# Patient Record
Sex: Female | Born: 2000 | Race: Black or African American | Hispanic: No | Marital: Single | Smoking: Never smoker
Health system: Southern US, Community
[De-identification: ages and names within clinical notes are randomized; demographics above are authoritative.]

## PROBLEM LIST (undated history)

## (undated) ENCOUNTER — Ambulatory Visit (HOSPITAL_COMMUNITY): Admission: EM | Disposition: A | Discharge status: Home / Self Care | Payer: Medicaid Other

## (undated) DIAGNOSIS — Z8619 Personal history of other infectious and parasitic diseases: Secondary | ICD-10-CM

## (undated) DIAGNOSIS — R011 Cardiac murmur, unspecified: Secondary | ICD-10-CM

## (undated) HISTORY — PX: NO PAST SURGERIES: SHX2092

## (undated) HISTORY — DX: Cardiac murmur, unspecified: R01.1

## (undated) HISTORY — DX: Personal history of other infectious and parasitic diseases: Z86.19

---
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
## 2020-01-16 ENCOUNTER — Other Ambulatory Visit: Payer: Self-pay

## 2020-01-16 ENCOUNTER — Other Ambulatory Visit (HOSPITAL_COMMUNITY)
Admission: RE | Admit: 2020-01-16 | Discharge: 2020-01-16 | Disposition: A | Discharge status: Home / Self Care | Payer: Medicaid Other | Source: Ambulatory Visit | Attending: Medical | Admitting: Medical

## 2020-01-16 ENCOUNTER — Ambulatory Visit (INDEPENDENT_AMBULATORY_CARE_PROVIDER_SITE_OTHER): Payer: Medicaid Other | Admitting: Medical

## 2020-01-16 ENCOUNTER — Other Ambulatory Visit: Payer: Medicaid Other

## 2020-01-16 ENCOUNTER — Encounter: Payer: Self-pay | Admitting: Medical

## 2020-01-16 VITALS — BP 121/75 | HR 100 | Wt 146.0 lb

## 2020-01-16 DIAGNOSIS — O98812 Other maternal infectious and parasitic diseases complicating pregnancy, second trimester: Secondary | ICD-10-CM | POA: Diagnosis present

## 2020-01-16 DIAGNOSIS — A749 Chlamydial infection, unspecified: Secondary | ICD-10-CM

## 2020-01-16 DIAGNOSIS — Z3A26 26 weeks gestation of pregnancy: Secondary | ICD-10-CM

## 2020-01-16 DIAGNOSIS — Z3400 Encounter for supervision of normal first pregnancy, unspecified trimester: Secondary | ICD-10-CM

## 2020-01-16 MED ORDER — BLOOD PRESSURE KIT DEVI
1.0000 | 0 refills | Status: AC
Start: 2020-01-16 — End: ?

## 2020-01-16 NOTE — Progress Notes (Signed)
ROB she unable to get GTT done because she drank Cranberry Juice this AM.  Declined FLU and TDAP Vaccines. C/o sides hurting at night 5/10 making it hard to sleep.

## 2020-01-16 NOTE — Progress Notes (Signed)
   PRENATAL VISIT NOTE  Subjective:  Ashley Graham is a 19 y.o. G1P0 at [redacted]w[redacted]d being seen today for ongoing prenatal care.  She is currently monitored for the following issues for this low-risk pregnancy and has Supervision of normal first teen pregnancy on their problem list.  Patient reports no complaints.  Contractions: Not present. Vag. Bleeding: None.  Movement: Present. Denies leaking of fluid.   The following portions of the patient's history were reviewed and updated as appropriate: allergies, current medications, past family history, past medical history, past social history, past surgical history and problem list.   Objective:   Vitals:   01/16/20 0926  BP: 121/75  Pulse: 100  Weight: 146 lb (66.2 kg)    Fetal Status: Fetal Heart Rate (bpm): 136 Fundal Height: 26 cm Movement: Present     General:  Alert, oriented and cooperative. Patient is in no acute distress.  Skin: Skin is warm and dry. No rash noted.   Cardiovascular: Normal heart rate noted  Respiratory: Normal respiratory effort, no problems with respiration noted  Abdomen: Soft, gravid, appropriate for gestational age.  Pain/Pressure: Absent     Pelvic: Cervical exam deferred        Extremities: Normal range of motion.  Edema: None  Mental Status: Normal mood and affect. Normal behavior. Normal judgment and thought content.   Assessment and Plan:  Pregnancy: G1P0 at [redacted]w[redacted]d 1. Encounter for supervision of normal pregnancy in teen primigravida, antepartum - Patient not NPO, will defer GTT until next visit  - Enroll Patient in Babyscripts - Babyscripts Schedule Optimization  2. [redacted] weeks gestation of pregnancy  3. Chlamydia infection affecting pregnancy in second trimester - Treated early September, no intercourse since then - TOC today  - Cervicovaginal ancillary only( [place])  Preterm labor symptoms and general obstetric precautions including but not limited to vaginal bleeding, contractions, leaking of  fluid and fetal movement were reviewed in detail with the patient. Please refer to After Visit Summary for other counseling recommendations.   Return in about 2 weeks (around 01/30/2020) for LOB, In-Person, 28 week labs (fasting).  Future Appointments  Date Time Provider Department Center  01/30/2020  9:00 AM [facility] None  01/30/2020 10:55 AM Rasch, Harolyn Rutherford, NP [facility] None    Vonzella Nipple, PA-C

## 2020-01-16 NOTE — Patient Instructions (Addendum)
Second Trimester of Pregnancy  The second trimester is from week 14 through week 27 (month 4 through 6). This is often the time in pregnancy that you feel your best. Often times, morning sickness has lessened or quit. You may have more energy, and you may get hungry more often. Your unborn baby is growing rapidly. At the end of the sixth month, he or she is about 9 inches long and weighs about 1 pounds. You will likely feel the baby move between 18 and 20 weeks of pregnancy. Follow these instructions at home: Medicines  Take over-the-counter and prescription medicines only as told by your doctor. Some medicines are safe and some medicines are not safe during pregnancy.  Take a prenatal vitamin that contains at least 600 micrograms (mcg) of folic acid.  If you have trouble pooping (constipation), take medicine that will make your stool soft (stool softener) if your doctor approves. Eating and drinking   Eat regular, healthy meals.  Avoid raw meat and uncooked cheese.  If you get low calcium from the food you eat, talk to your doctor about taking a daily calcium supplement.  Avoid foods that are high in fat and sugars, such as fried and sweet foods.  If you feel sick to your stomach (nauseous) or throw up (vomit): ? Eat 4 or 5 small meals a day instead of 3 large meals. ? Try eating a few soda crackers. ? Drink liquids between meals instead of during meals.  To prevent constipation: ? Eat foods that are high in fiber, like fresh fruits and vegetables, whole grains, and beans. ? Drink enough fluids to keep your pee (urine) clear or pale yellow. Activity  Exercise only as told by your doctor. Stop exercising if you start to have cramps.  Do not exercise if it is too hot, too humid, or if you are in a place of great height (high altitude).  Avoid heavy lifting.  Wear low-heeled shoes. Sit and stand up straight.  You can continue to have sex unless your doctor tells you not  to. Relieving pain and discomfort  Wear a good support bra if your breasts are tender.  Take warm water baths (sitz baths) to soothe pain or discomfort caused by hemorrhoids. Use hemorrhoid cream if your doctor approves.  Rest with your legs raised if you have leg cramps or low back pain.  If you develop puffy, bulging veins (varicose veins) in your legs: ? Wear support hose or compression stockings as told by your doctor. ? Raise (elevate) your feet for 15 minutes, 3-4 times a day. ? Limit salt in your food. Prenatal care  Write down your questions. Take them to your prenatal visits.  Keep all your prenatal visits as told by your doctor. This is important. Safety  Wear your seat belt when driving.  Make a list of emergency phone numbers, including numbers for family, friends, the hospital, and police and fire departments. General instructions  Ask your doctor about the right foods to eat or for help finding a counselor, if you need these services.  Ask your doctor about local prenatal classes. Begin classes before month 6 of your pregnancy.  Do not use hot tubs, steam rooms, or saunas.  Do not douche or use tampons or scented sanitary pads.  Do not cross your legs for long periods of time.  Visit your dentist if you have not done so. Use a soft toothbrush to brush your teeth. Floss gently.  Avoid all smoking, herbs,   and alcohol. Avoid drugs that are not approved by your doctor.  Do not use any products that contain nicotine or tobacco, such as cigarettes and e-cigarettes. If you need help quitting, ask your doctor.  Avoid cat litter boxes and soil used by cats. These carry germs that can cause birth defects in the baby and can cause a loss of your baby (miscarriage) or stillbirth. Contact a doctor if:  You have mild cramps or pressure in your lower belly.  You have pain when you pee (urinate).  You have bad smelling fluid coming from your vagina.  You continue to  feel sick to your stomach (nauseous), throw up (vomit), or have watery poop (diarrhea).  You have a nagging pain in your belly area.  You feel dizzy. Get help right away if:  You have a fever.  You are leaking fluid from your vagina.  You have spotting or bleeding from your vagina.  You have severe belly cramping or pain.  You lose or gain weight rapidly.  You have trouble catching your breath and have chest pain.  You notice sudden or extreme puffiness (swelling) of your face, hands, ankles, feet, or legs.  You have not felt the baby move in over an hour.  You have severe headaches that do not go away when you take medicine.  You have trouble seeing. Summary  The second trimester is from week 14 through week 27 (months 4 through 6). This is often the time in pregnancy that you feel your best.  To take care of yourself and your unborn baby, you will need to eat healthy meals, take medicines only if your doctor tells you to do so, and do activities that are safe for you and your baby.  Call your doctor if you get sick or if you notice anything unusual about your pregnancy. Also, call your doctor if you need help with the right food to eat, or if you want to know what activities are safe for you. This information is not intended to replace advice given to you by your health care provider. Make sure you discuss any questions you have with your health care provider. Document Revised: 06/29/2018 Document Reviewed: 04/12/2016 Elsevier Patient Education  2020 Elsevier Inc.  AREA PEDIATRIC/FAMILY PRACTICE PHYSICIANS  [place] ([address]) . [facility] o Chambliss, MD; Eniola, MD; Hale, MD; Hensel, MD; McDiarmid, MD; McIntyer, MD; Neal, MD; Walden, MD o [address] [place], [place] [address] o [phone] o Mon-Fri 8:30-12:30, 1:30-5:00 o Providers come to see babies at [facility] o Accepting Medicaid . [facility] o Limited providers who accept newborns: Koirala, MD; Morrow, MD; Wolters, MD o [address], [place], [place] [address] o [phone] o Mon-Fri 8:00-5:30 o Babies seen by providers at [facility] o Does NOT accept Medicaid o Please call early in hospitalization for appointment (limited availability)  . [facility] o Mulberry, MD o [address] [place], [place] [address] o [phone] o Mon, Tue, Thur, Fri 8:30-5:00, Wed 10:00-7:00 (closed 1-2pm) o Babies seen by [facility] providers o Accepting Medicaid . [facility] o Rubin, MD o [address], , [place] [address] o [phone] o Mon-Fri 8:30-5:00, Sat 8:30-12:00 o Provider comes to see babies at [facility] o Accepting Medicaid o Must have been referred from current patients or contacted office prior to delivery . [facility] (  [facility]) o Brown, MD; Chandler, MD; Ettefagh, MD; Grant, MD; Lester, MD; McCormick, MD; McQueen, MD; Prose, MD; Simha, MD; Stanley, MD; Stryffeler, NP; Tebben, NP o [address], [place], [place] [address] o [phone] o Mon, Tue, Thur, Fri 8:30-5:30, Wed 9:30-5:30, Sat 8:30-12:30 o Babies seen by [facility] providers o Accepting Medicaid o Only accepting infants of first-time parents or siblings of current patients o Hospital discharge coordinator will make follow-up appointment . Jack Amos o [address], [place], [place]  [address] o [phone]   Fax - [phone] . [facility] o [address], [place], [place]  [address] o Phone - [phone]   Fax - [phone] . Shilpa Gosrani o [address], [place], [place]  [address] o [phone]  [place] ([address]) . [facility] o Bates, MD; [place], MD; Cooper, Cox, MD; MD; Davis, MD; Dovico, MD;  Ettefaugh, MD; Little, MD; Lowe, MD; Keiffer, MD; Melvin, MD; Sumner, MD; Williams, MD o [address], [place], [place] [address] o [phone] o Mon-Fri 8:30-5:00 (extended evenings Mon-Thur as needed), Sat-Sun 10:00-1:00 o Providers come to see babies at [facility] o Accepting Medicaid for families of first-time babies and families with all children in the household age 3 and under. Must register with office prior to making appointment (M-F only). . [facility] o Henson, NP; Knapp, MD; Lalonde, MD; Tysinger, PA o [address] [place], [place] [address] o [phone] o Mon-Fri 8:00-5:00 o Babies seen by providers at [facility] o Does NOT accept Medicaid/Commercial Insurance Only . [facility] ([facility])  o Artis, MD; Barnes, MD; Bratton, MD; Coccaro, MD; Lockett Gardner, MD; Kramer, MD; Marshall, MD; Netherton, MD; Poleto, MD; Skinner, MD o [address] [place], [place] [address] o [phone] o Mon-Fri 8:30-5:30, Sat (Oct.-Mar.) 9:00-1:00 o Babies seen by providers at [facility] o Accepting Medicaid  [place] ([address]) . [facility] o Reid, MD; Warner, MD o [address], [place], [place] [address] o [phone] o Mon-Fri 8:30-5:00, Sat 8:30-12:00 o Providers come to see babies at [facility] o Does NOT accept Medicaid . [facility] o Becker, PA; Hagler, MD; Scifres, PA; Sun, MD; Swayne, MD o [address], [place], [place] [address] o [phone] o Mon-Fri 8:00-5:00 o Babies seen by providers at [facility] o Does NOT accept Medicaid o Only accepting babies of parents who are patients o Please call early in hospitalization for appointment (limited availability) . [facility] o Clark, MD; Frye, MD; Kelleher, MD; Mack, NP; Miller, MD; O'Keller, MD; Patterson, NP; Pudlo, MD; Puzio, MD; Thomas, MD;  Tucker, MD; Twiselton, MD o [address], [place], [place] [address] o [phone] o Mon-Fri 8:00-5:00, Sat 9:00-12:00 o Providers come to see babies at [facility] o Does NOT accept Medicaid  [place] ([address]) . [facility] o Limited providers accepting new patients: Brake, NP; Wharton, PA o [address], [place], [place] [address] o [phone] o Mon-Fri 8:00-5:00 o Babies seen by providers at [facility] o Does NOT accept Medicaid o Only accepting babies of parents who are patients o Please call early in hospitalization for appointment (limited availability) . [facility] o Gay, MD; Quinlan, MD o [address] , [place] [address] o [phone] (press 1 to schedule appointment) o Mon-Fri 8:00-5:00 o Providers come to   see babies at [facility] o Does NOT accept Medicaid . [facility] o Mazer, MD o [address] [place], [place] [address] o [phone] o Mon-Fri 8:30-5:00 (lunch 12:30-1:00), extended hours by appointment only Wed 5:00-6:30 o Babies seen by [facility] providers o Accepting Medicaid . [facility] o Banks, MD; Jordan, MD; Koberlein, MD o [address], [place], [place] [address] o [phone] o Mon-Fri 8:00-5:00 o Babies seen by [facility] providers o Does NOT accept Medicaid . [facility] o Parker, MD; Hunter, MD; Wallace, DO o [address] [place], [place] [address] o [phone] o Mon-Fri 8:00-5:00 o Babies seen by [facility] providers o Does NOT accept Medicaid . [facility] o Brandon, PA; Brecken, PA; Christy, NP; Dees, MD; DeClaire, MD; DeWeese, MD; Hansen, NP; Mills, NP; Parrish, NP; Smoot, NP; Summer, MD; Vapne, MD o [address] [place], [place] [address] o [phone] o Mon-Fri 8:30-5:00, Sat 10:00-1:00 o Providers come to see babies at [facility] o Does NOT accept Medicaid o Free prenatal information session Tuesdays at 4:45pm . [facility] o Bouska, MD; Gordon, PA; Jeffery, PA; Weber, PA o [address] [place] [address] o [phone] o Mon-Fri 7:30-5:30 o Babies seen by [facility] providers . [facility] o [address], [place], [place]  [address] o [phone]   Fax - [phone]  [place] ([address]) . [facility] o Reese, MD o [address] [place], [place] [address] o [phone] o Mon-Thur 8:00-6:00 o Providers come to see babies at [facility] o Accepting Medicaid . [facility] o Anderson, NP; Badger, MD; Beal, PA; Spencer, PA o [address] [place], [place] [address] o [phone] o Mon-Thur 7:30-7:30, Fri 7:30-4:30 o Babies seen by [facility] providers o Accepting Medicaid . [facility] o Agbuya, MD; Klett, NP; Romgoolam, MD o [address], [place], [place] [address] o [phone] o Mon-Fri 8:30-5:00, Sat 8:30-12:00 o Providers come to see babies at [facility] o Accepting Medicaid o Must have "Meet & Greet" appointment at office prior to delivery . [facility] ([facility] o McCord, MD; Wallace, MD; Wood, MD o [address], [place], [place] [address] o [phone] o Mon-Wed 8:00-6:00, Thur-Fri 8:00-5:00, Sat 9:00-12:00 o Providers come to see babies at [facility] o Does NOT accept Medicaid o Only accepting siblings of current patients . [facility]  o [address], [place], [place]  [address] o [phone]   Fax - [phone] . [facility] o [address], [place], [place]  [address] o [phone]   Fax - [phone]  [place] ([address] & [address]) . [facility] o Cirigliano, DO; Matthews, DO o [address] [place], [place] [address] o [phone] o Mon-Fri 7:00-5:00 o Babies seen by [facility] providers o Does NOT accept Medicaid . [facility] o Briscoe, MD; Howley, PA; Moreira, PA o [address], [place], [place] [address] o [phone] o Mon-Fri 8:00-5:00 o Babies seen by [facility] providers o Accepting Medicaid . [facility] o Boyd, MD; Church, PA; Jones, NP; Osborn, PA o [address], [place],  [address] o [phone] o Mon-Fri 8:00-5:00 o Babies seen by providers at [facility] o   Accepting Medicaid  [place] ([address]) . [facility] [place] o Wendling, DO o [address] [place], [place] [address] o [phone] o Mon-Fri 8:00-5:00 o Babies seen by [facility] providers o Does NOT accept Medicaid o Limited availability, please call early in hospitalization to schedule follow-up . [facility] o Calderon, PA; Cummings, MD; Dillard, MD; Martin, PA; Olson, MD; VanDeven, PA o [address], [place], [place] [address] o [phone] o Mon-Fri 8:30-5:00, Sat 9:00-12:00 o Babies seen by providers at [facility] o Accepting Medicaid o Please register online then schedule online or call office o [email or web address] . [facility] ([facility] o Hunter, NP; Kumar, MD; Martin Rogers, PA o [address], [place], [place] [address] o [phone] o Mon-Fri 8:00-5:00 o Babies seen by providers at [facility] o Accepting Medicaid . [facility] ([facility] o Honaker, MD; Kristi Fleenor, NP; West, MD o [address], [place], [place] [address] o [phone] o Mon-Fri 8:00-5:30, Sat&Sun by  appointment (phones open at 8:30) o Babies seen by [facility] providers o Accepting Medicaid o Must be a first-time baby or sibling of current patient . [facility] [place]  o [address], [place], [place]  [address] o [phone]   Fax - [phone]  [place] ([address]) . [facility] o Brown, PA; Cowen, PA; Rice, MD; Helton, PA; Spry, MD o [address] [place], [place] [address] o [phone] o Mon-Thur 8:00-7:00, Fri 8:00-5:00, Sat 8:00-12:00, Sun 9:00-12:00 o Babies seen by [facility] providers o Accepting Medicaid . [facility] [place] o Coe-Goins, MD; Marshall, MD; Pierre-Louis, MD o [address] [place] [address], [place], [place] [address] o [phone] o Mon-Thur 8:00-5:00 o Babies seen by providers at [facility] o Accepting Medicaid . [facility] o Bratton, MD; Coe-Goins, MD; Hayes, MD; Lewis, MD; List, MD; Crittendon, MD; Marshall, MD; Moran, MD; O'Neal, MD; Pierre-Louis, MD; Pitonzo, MD; Scholer, MD; Spangle, MD o [address] [place], [place] [address] o [phone] o Mon-Fri 8:00-5:30, Sat (Oct.-Mar.) 9:00-1:00 o Babies seen by providers at [facility] o Accepting Medicaid o Must fill out new patient packet, available online at [email or web address] . [facility] ([facility] o Friddle, NP; Harris, NP; Kelly, NP; Logan, MD; Melvin, PA; Poth, MD; Ramadoss, MD; Stanton, NP o [address], [place], [place] [address] o [phone] o Mon-Thur 8:00-5:30, Fri 8:00-5:00 o Babies seen by providers at [facility] o Accepting Medicaid  [place] ([address]) . [facility] o Dixon, PA; Evanston, MD; Pickard, MD; Tapia, PA o [address], [place], [place] [address] o [phone] o Mon-Fri 8:00-5:00 o Babies seen  by providers at [facility] o Accepting Medicaid   [place] ([address]) . [facility] [place] o Masneri, DO; Meyers, MD; Nelson, PA o [address], [place], [place] [address] o [phone] o Mon-Fri 8:00-5:00 o Babies seen by providers at [facility] o Does NOT accept Medicaid o Limited appointment availability, please call early in hospitalization  . [facility] [place] o Kunedd, DO; McGowen, MD o [address], [place], [place] [address] o [phone] o   Mon-Fri 8:00-5:00 o Babies seen by [facility] providers o Does NOT accept Medicaid . [facility] [place] o Cameron, MD; MacDonald, MD; Michaels, PA; Nayak, MD o [address] [place] [address], [place], [place] [address] o [phone] o Mon-Fri 8:00-5:00 o After hours clinic ([address] [place], [place] [address]) [phone] Mon-Fri 5:00-8:00, Sat 12:00-6:00, Sun 10:00-4:00 o Babies seen by [facility] providers o Accepting Medicaid . [facility] [place] o [address], [place], [place]  [address] o [phone]   Fax - [phone]  [place] ([address]) . [facility] [place] o Andy, MD o [address], [place], [place] [address] o [phone] o Mon-Fri 8:00-5:00 o Babies seen by [facility] providers o Does NOT accept Medicaid . [facility] [place] ([facility] [place]) o Eksir, MD o [address], [place], [place] [address] o [phone] o Mon-Thur 8:00-7:00, Fri 8:00-5:00, Sat 8:00-12:00 o Babies seen by providers at [facility] o Accepting Medicaid - but does not have vaccinations in office (must be received elsewhere) o Limited availability, please call early in hospitalization  [place] ([address]) . [facility]  o Charlene Flemming, MD o [address], [place] [address] o [phone]  Fax [phone]    

## 2020-01-17 LAB — CBC
Hematocrit: 27.1 % — ABNORMAL LOW (ref 34.0–46.6)
Hemoglobin: 8.5 g/dL — ABNORMAL LOW (ref 11.1–15.9)
MCH: 27 pg (ref 26.6–33.0)
MCHC: 31.4 g/dL — ABNORMAL LOW (ref 31.5–35.7)
MCV: 86 fL (ref 79–97)
Platelets: 269 10*3/uL (ref 150–450)
RBC: 3.15 x10E6/uL — ABNORMAL LOW (ref 3.77–5.28)
RDW: 13.4 % (ref 11.7–15.4)
WBC: 9.2 10*3/uL (ref 3.4–10.8)

## 2020-01-17 LAB — CERVICOVAGINAL ANCILLARY ONLY
Chlamydia: NEGATIVE
Comment: NEGATIVE
Comment: NEGATIVE
Comment: NORMAL
Neisseria Gonorrhea: NEGATIVE
Trichomonas: NEGATIVE

## 2020-01-17 LAB — RPR: RPR Ser Ql: NONREACTIVE

## 2020-01-17 LAB — HIV ANTIBODY (ROUTINE TESTING W REFLEX): HIV Screen 4th Generation wRfx: NONREACTIVE

## 2020-01-17 NOTE — Addendum Note (Signed)
Addended by: Marny Lowenstein on: 01/17/2020 08:55 AM   Modules accepted: Orders, SmartSet

## 2020-01-19 ENCOUNTER — Other Ambulatory Visit: Payer: Self-pay

## 2020-01-19 ENCOUNTER — Encounter (HOSPITAL_COMMUNITY): Payer: Self-pay | Admitting: Obstetrics and Gynecology

## 2020-01-19 ENCOUNTER — Inpatient Hospital Stay (HOSPITAL_COMMUNITY)
Admission: AD | Admit: 2020-01-19 | Discharge: 2020-01-19 | Disposition: A | Discharge status: Home / Self Care | Payer: Medicaid Other | Attending: Obstetrics & Gynecology | Admitting: Obstetrics & Gynecology

## 2020-01-19 DIAGNOSIS — O99891 Other specified diseases and conditions complicating pregnancy: Secondary | ICD-10-CM | POA: Diagnosis not present

## 2020-01-19 DIAGNOSIS — Z20822 Contact with and (suspected) exposure to covid-19: Secondary | ICD-10-CM | POA: Diagnosis not present

## 2020-01-19 DIAGNOSIS — O99322 Drug use complicating pregnancy, second trimester: Secondary | ICD-10-CM | POA: Diagnosis not present

## 2020-01-19 DIAGNOSIS — R059 Cough, unspecified: Secondary | ICD-10-CM | POA: Diagnosis not present

## 2020-01-19 DIAGNOSIS — R0981 Nasal congestion: Secondary | ICD-10-CM | POA: Insufficient documentation

## 2020-01-19 DIAGNOSIS — O99513 Diseases of the respiratory system complicating pregnancy, third trimester: Secondary | ICD-10-CM | POA: Insufficient documentation

## 2020-01-19 DIAGNOSIS — R058 Other specified cough: Secondary | ICD-10-CM

## 2020-01-19 DIAGNOSIS — R6883 Chills (without fever): Secondary | ICD-10-CM | POA: Diagnosis not present

## 2020-01-19 DIAGNOSIS — Z3A27 27 weeks gestation of pregnancy: Secondary | ICD-10-CM | POA: Diagnosis not present

## 2020-01-19 LAB — RESPIRATORY PANEL BY RT PCR (FLU A&B, COVID)
Influenza A by PCR: NEGATIVE
Influenza B by PCR: NEGATIVE
SARS Coronavirus 2 by RT PCR: NEGATIVE

## 2020-01-19 NOTE — MAU Note (Addendum)
Pt reports that for the last two days she has been having chills, cold sweats, and congestion.  Pt denies taking any otc medication for the sx's.  +FM

## 2020-01-19 NOTE — [facility] Provider Note (Signed)
History     CSN: [id number]  Arrival date and time: 01/19/20 5885   First Provider Initiated Contact with Patient 01/19/20 [phone]      Chief Complaint  Patient presents with  . Cough  . Nasal Congestion  . Chills   HPI  Ashley Graham is a 19 y.o. G1P0 at [redacted]w[redacted]d who presents with cough, congestion, & chills. Symptoms started 2 days ago. Reports that firs the last 2 nights she was woken up with chills. Does not have a thermometer at home so has been unable to check her temperature. She reports chest & nasal congestion. Has had a non productive cough. She has not taken anything for her symptoms.\ Denies sore throat, ear pain, headache, chest pain, SOB, n/v, abdominal pain. Her little brother is sick with similar symptoms. She has not had the COVID or flu vaccines. Denies any OB complaints.   OB History    Gravida  1   Para      Term      Preterm      AB      Living        SAB      TAB      Ectopic      Multiple      Live Births              Past Medical History:  Diagnosis Date  . Heart murmur   . History of PCR DNA positive for HSV1     Past Surgical History:  Procedure Laterality Date  . NO PAST SURGERIES      Family History  Problem Relation Age of Onset  . Healthy Mother   . Diabetes Maternal Uncle   . Diabetes Other   . Hypertension Other     Social History   Tobacco Use  . Smoking status: Never Smoker  . Smokeless tobacco: Never Used  Vaping Use  . Vaping Use: Never used  Substance Use Topics  . Alcohol use: Never  . Drug use: Yes    Types: Marijuana    Comment: last use 11/14/19    Allergies: No Known Allergies  Medications Prior to Admission  Medication Sig Dispense Refill Last Dose  . Prenatal Vit-Fe Phos-FA-Omega (VITAFOL GUMMIES) 3.33-0.333-34.8 MG CHEW Chew 3 tablets by mouth daily before breakfast. 90 tablet 11 01/19/2020 at Unknown time  . Blood Pressure Monitoring (BLOOD PRESSURE KIT) DEVI 1 kit by Does not apply route  once a week. Check Blood Pressure regularly and record readings into the [employer] App.  Large Cuff.  DX O90.0 1 each 0 Unknown at Unknown time  . cetirizine (ZYRTEC ALLERGY) 10 MG tablet Take 1 tablet (10 mg total) by mouth daily for 14 days. 14 tablet 0   . fluticasone (FLONASE) 50 MCG/ACT nasal spray Place 1 spray into both nostrils daily. (Patient not taking: Reported on 11/06/2019) 11.1 mL 0 Unknown at Unknown time    Review of Systems  Constitutional: Positive for chills. Negative for fever.  HENT: Positive for congestion. Negative for ear pain, sinus pain, sneezing and sore throat.   Respiratory: Positive for cough. Negative for shortness of breath and wheezing.   Cardiovascular: Negative.   Gastrointestinal: Negative.   Genitourinary: Negative.   Musculoskeletal: Negative for myalgias.  Neurological: Negative for headaches.   Physical Exam   Blood pressure 124/82, pulse 98, temperature 98.8 F (37.1 C), temperature source Oral, resp. rate 16, weight 66.4 kg, last menstrual period 07/14/2019, SpO2 99 %.  Physical  Exam Vitals and nursing note reviewed.  Constitutional:      General: She is not in acute distress.    Appearance: Normal appearance. She is not ill-appearing, toxic-appearing or diaphoretic.  HENT:     Head: Normocephalic and atraumatic.  Cardiovascular:     Rate and Rhythm: Normal rate and regular rhythm.     Heart sounds: Normal heart sounds.  Pulmonary:     Effort: Pulmonary effort is normal. No respiratory distress.     Breath sounds: Normal breath sounds. No wheezing.  Skin:    General: Skin is warm and dry.  Neurological:     Mental Status: She is alert.  Psychiatric:        Mood and Affect: Mood normal.        Behavior: Behavior normal.    Fetal Tracing:  Baseline: 130 Variability: min to mod Accelerations: 10x10 Decelerations: none  Toco: x1   MAU Course  Procedures No results found for this or any previous visit (from the past 24  hour(s)).  MDM Fetal tracing appropriate for gestational age.  No OB complaints.   Vital signs WNL. She is afebrile. Denies chest pain or SOB. Lung sounds are clear throughout.  Will test for flu & COVID.  Discussed symptomatic treatment regardless of results. Reviewed that if COVID is positive we would recommend outpatient MAB, if flu positive would prescribe tamiflu. She is agreeable with this plan.   Assessment and Plan   1. Nasal congestion   2. Non-productive cough   3. Chills (without fever)   4. [redacted] weeks gestation of pregnancy    -COVID & flu tests pending -Discussed symptomatic treatment & given list of OTC Medications safe to take at home. Reviewed quarantining at home until results come back later today.   Jorje Guild 01/19/2020, 6:16 AM

## 2020-01-19 NOTE — Discharge Instructions (Signed)

## 2020-01-28 ENCOUNTER — Inpatient Hospital Stay (HOSPITAL_COMMUNITY): Admission: RE | Admit: 2020-01-28 | Payer: Medicaid Other | Source: Ambulatory Visit

## 2020-01-30 ENCOUNTER — Other Ambulatory Visit: Payer: Medicaid Other

## 2020-01-30 ENCOUNTER — Other Ambulatory Visit: Payer: Self-pay

## 2020-01-30 ENCOUNTER — Ambulatory Visit (INDEPENDENT_AMBULATORY_CARE_PROVIDER_SITE_OTHER): Payer: Medicaid Other | Admitting: Obstetrics and Gynecology

## 2020-01-30 VITALS — BP 137/67 | HR 86 | Wt 147.4 lb

## 2020-01-30 DIAGNOSIS — O99013 Anemia complicating pregnancy, third trimester: Secondary | ICD-10-CM

## 2020-01-30 DIAGNOSIS — Z23 Encounter for immunization: Secondary | ICD-10-CM

## 2020-01-30 DIAGNOSIS — Z3400 Encounter for supervision of normal first pregnancy, unspecified trimester: Secondary | ICD-10-CM

## 2020-01-30 NOTE — Progress Notes (Signed)
   PRENATAL VISIT NOTE  Subjective:  Ashley Graham is a 19 y.o. G1P0 at [redacted]w[redacted]d being seen today for ongoing prenatal care.  She is currently monitored for the following issues for this low-risk pregnancy and has Supervision of normal first teen pregnancy on their problem list.  Patient reports no complaints.  Contractions: Not present. Vag. Bleeding: None.  Movement: Present. Denies leaking of fluid.   The following portions of the patient's history were reviewed and updated as appropriate: allergies, current medications, past family history, past medical history, past social history, past surgical history and problem list.   Objective:   Vitals:   01/30/20 1017  BP: 137/67  Pulse: 86  Weight: 147 lb 6.4 oz (66.9 kg)    Fetal Status:     Movement: Present     General:  Alert, oriented and cooperative. Patient is in no acute distress.  Skin: Skin is warm and dry. No rash noted.   Cardiovascular: Normal heart rate noted  Respiratory: Normal respiratory effort, no problems with respiration noted  Abdomen: Soft, gravid, appropriate for gestational age.  Pain/Pressure: Absent     Pelvic: Cervical exam deferred        Extremities: Normal range of motion.  Edema: None  Mental Status: Normal mood and affect. Normal behavior. Normal judgment and thought content.   Assessment and Plan:  Pregnancy: G1P0 at [redacted]w[redacted]d 1. Encounter for supervision of normal pregnancy in teen primigravida, antepartum  - Will get TDAP next visit.  - Glucose Tolerance, 2 Hours w/1 Hour - CBC - HIV Antibody (routine testing w rflx) - RPR  Preterm labor symptoms and general obstetric precautions including but not limited to vaginal bleeding, contractions, leaking of fluid and fetal movement were reviewed in detail with the patient. Please refer to After Visit Summary for other counseling recommendations.   No follow-ups on file.  Future Appointments  Date Time Provider Department Center  02/12/2020  1:40 PM  Burleson, Brand Males, NP [facility] None    Venia Carbon, NP

## 2020-01-30 NOTE — Progress Notes (Signed)
ROB  CC asking if there is something she can use to position herself for a better night sleep. Not use to sleeping side or back.  Unsure of T-dap wants to discuss.   T-Dap given

## 2020-01-31 LAB — GLUCOSE TOLERANCE, 2 HOURS W/ 1HR
Glucose, 1 hour: 124 mg/dL (ref 65–179)
Glucose, 2 hour: 93 mg/dL (ref 65–152)
Glucose, Fasting: 70 mg/dL (ref 65–91)

## 2020-01-31 LAB — CBC
Hematocrit: 26.5 % — ABNORMAL LOW (ref 34.0–46.6)
Hemoglobin: 8.6 g/dL — ABNORMAL LOW (ref 11.1–15.9)
MCH: 27 pg (ref 26.6–33.0)
MCHC: 32.5 g/dL (ref 31.5–35.7)
MCV: 83 fL (ref 79–97)
Platelets: 254 10*3/uL (ref 150–450)
RBC: 3.19 x10E6/uL — ABNORMAL LOW (ref 3.77–5.28)
RDW: 13.4 % (ref 11.7–15.4)
WBC: 7.2 10*3/uL (ref 3.4–10.8)

## 2020-01-31 LAB — HIV ANTIBODY (ROUTINE TESTING W REFLEX): HIV Screen 4th Generation wRfx: NONREACTIVE

## 2020-01-31 LAB — RPR: RPR Ser Ql: NONREACTIVE

## 2020-02-02 ENCOUNTER — Other Ambulatory Visit: Payer: Self-pay | Admitting: Obstetrics and Gynecology

## 2020-02-02 DIAGNOSIS — O99019 Anemia complicating pregnancy, unspecified trimester: Secondary | ICD-10-CM

## 2020-02-02 HISTORY — DX: Anemia complicating pregnancy, unspecified trimester: O99.019

## 2020-02-02 MED ORDER — IRON 325 (65 FE) MG PO TABS: 1.0000 | ORAL_TABLET | Freq: Two times a day (BID) | ORAL | 1 refills | Status: DC

## 2020-02-02 NOTE — Progress Notes (Signed)
an

## 2020-02-12 ENCOUNTER — Ambulatory Visit (INDEPENDENT_AMBULATORY_CARE_PROVIDER_SITE_OTHER): Payer: Medicaid Other | Admitting: Nurse Practitioner

## 2020-02-12 ENCOUNTER — Other Ambulatory Visit: Payer: Self-pay

## 2020-02-12 VITALS — BP 131/75 | HR 94 | Wt 150.2 lb

## 2020-02-12 DIAGNOSIS — O99013 Anemia complicating pregnancy, third trimester: Secondary | ICD-10-CM

## 2020-02-12 DIAGNOSIS — Z3400 Encounter for supervision of normal first pregnancy, unspecified trimester: Secondary | ICD-10-CM

## 2020-02-12 DIAGNOSIS — Z3A30 30 weeks gestation of pregnancy: Secondary | ICD-10-CM

## 2020-02-12 MED ORDER — DOCUSATE CALCIUM 240 MG PO CAPS: 240.0000 mg | ORAL_CAPSULE | Freq: Every day | ORAL | 2 refills | Status: DC

## 2020-02-12 NOTE — Patient Instructions (Signed)
Schedule childbirth and breastfeeding classes at [email or web address]

## 2020-02-12 NOTE — Progress Notes (Signed)
    Subjective:  Ashley Graham is a 19 y.o. G1P0 at [redacted]w[redacted]d being seen today for ongoing prenatal care.  She is currently monitored for the following issues for this low-risk pregnancy and has Supervision of normal first teen pregnancy and Anemia in pregnancy on their problem list.  Patient reports no complaints.  Contractions: Not present. Vag. Bleeding: None.  Movement: Present. Denies leaking of fluid.   The following portions of the patient's history were reviewed and updated as appropriate: allergies, current medications, past family history, past medical history, past social history, past surgical history and problem list. Problem list updated.  Objective:   Vitals:   02/12/20 1319  BP: 131/75  Pulse: 94  Weight: 150 lb 3.2 oz (68.1 kg)    Fetal Status: Fetal Heart Rate (bpm): 138 Fundal Height: 30 cm Movement: Present     General:  Alert, oriented and cooperative. Patient is in no acute distress.  Skin: Skin is warm and dry. No rash noted.   Cardiovascular: Normal heart rate noted  Respiratory: Normal respiratory effort, no problems with respiration noted  Abdomen: Soft, gravid, appropriate for gestational age. Pain/Pressure: Absent     Pelvic:  Cervical exam deferred        Extremities: Normal range of motion.  Edema: None  Mental Status: Normal mood and affect. Normal behavior. Normal judgment and thought content.   Urinalysis:      Assessment and Plan:  Pregnancy: G1P0 at [redacted]w[redacted]d  1. Encounter for supervision of normal pregnancy in teen primigravida, antepartum Advised to sign up for breastfeeding and childbirth classes - website given in AVS Does not plan to get Covid vaccine Had a cold recently and went to [facility] - now is better without lingering symptoms  2. Anemia during pregnancy in third trimester Planning to start iron supplement today. Was not aware fereheme had been ordered - in check out note to schedule infusion  - Anemia panel   Preterm labor symptoms and  general obstetric precautions including but not limited to vaginal bleeding, contractions, leaking of fluid and fetal movement were reviewed in detail with the patient. Please refer to After Visit Summary for other counseling recommendations.  Return in about 2 weeks (around 02/26/2020) for in person ROB.  Nolene Bernheim, RN, MSN, NP-BC Nurse Practitioner, [facility] for [employer], [facility], [facility] 02/12/2020 2:26 PM

## 2020-02-12 NOTE — Progress Notes (Signed)
Pt reports fetal movement, denies pain.  

## 2020-02-15 LAB — ANEMIA PANEL
Ferritin: 7 ng/mL — ABNORMAL LOW (ref 15–77)
Hematocrit: 27.2 % — ABNORMAL LOW (ref 34.0–46.6)
Iron Saturation: 50 % (ref 15–55)
Iron: 271 ug/dL (ref 27–159)
Retic Ct Pct: 2.6 % (ref 0.6–2.6)
Total Iron Binding Capacity: 537 ug/dL — ABNORMAL HIGH (ref 250–450)
UIBC: 266 ug/dL (ref 131–425)
Vitamin B-12: 426 pg/mL (ref 232–1245)

## 2020-02-26 ENCOUNTER — Encounter: Payer: Self-pay | Admitting: Obstetrics

## 2020-02-26 ENCOUNTER — Other Ambulatory Visit: Payer: Self-pay

## 2020-02-26 ENCOUNTER — Ambulatory Visit (INDEPENDENT_AMBULATORY_CARE_PROVIDER_SITE_OTHER): Payer: Medicaid Other | Admitting: Obstetrics

## 2020-02-26 VITALS — BP 129/78 | HR 94 | Wt 158.2 lb

## 2020-02-26 DIAGNOSIS — Z3400 Encounter for supervision of normal first pregnancy, unspecified trimester: Secondary | ICD-10-CM

## 2020-02-26 DIAGNOSIS — O99013 Anemia complicating pregnancy, third trimester: Secondary | ICD-10-CM

## 2020-02-26 NOTE — Progress Notes (Signed)
Subjective:  Ashley Graham is a 19 y.o. G1P0 at [redacted]w[redacted]d being seen today for ongoing prenatal care.  She is currently monitored for the following issues for this low-risk pregnancy and has Supervision of normal first teen pregnancy and Anemia in pregnancy on their problem list.  Patient reports that she thinks that she may have had some leaking of fluid last week, but no leaking since then.  Contractions: Not present. Vag. Bleeding: None.  Movement: Present. Denies leaking of fluid.   The following portions of the patient's history were reviewed and updated as appropriate: allergies, current medications, past family history, past medical history, past social history, past surgical history and problem list. Problem list updated.  Objective:   Vitals:   02/26/20 1334  BP: 129/78  Pulse: 94  Weight: 158 lb 3.2 oz (71.8 kg)    Fetal Status:     Movement: Present     General:  Alert, oriented and cooperative. Patient is in no acute distress.  Skin: Skin is warm and dry. No rash noted.   Cardiovascular: Normal heart rate noted  Respiratory: Normal respiratory effort, no problems with respiration noted  Abdomen: Soft, gravid, appropriate for gestational age. Pain/Pressure: Absent     Pelvic:  Cervical exam performed      SSE: no pooling.  Nitrazine negative.  Cvx closed.  Extremities: Normal range of motion.  Edema: None  Mental Status: Normal mood and affect. Normal behavior. Normal judgment and thought content.   Urinalysis:      Assessment and Plan:  Pregnancy: G1P0 at [redacted]w[redacted]d  1. Encounter for supervision of normal pregnancy in teen primigravida, antepartum  2. Anemia during pregnancy in third trimester - FERAHEME infusion ordered  Preterm labor symptoms and general obstetric precautions including but not limited to vaginal bleeding, contractions, leaking of fluid and fetal movement were reviewed in detail with the patient. Please refer to After Visit Summary for other counseling  recommendations.   Return in about 2 weeks (around 03/11/2020) for ROB.   Brock Bad, MD  02/26/20

## 2020-02-26 NOTE — Progress Notes (Signed)
Pt reports fetal movement, denies pain. Pt states that last week she was leaking clear water like fluid, wants to check to make sure it is not amniotic fluid.

## 2020-03-04 NOTE — Discharge Instructions (Signed)

## 2020-03-05 ENCOUNTER — Encounter (HOSPITAL_COMMUNITY)
Admission: RE | Admit: 2020-03-05 | Discharge: 2020-03-05 | Disposition: A | Discharge status: Home / Self Care | Payer: Medicaid Other | Source: Ambulatory Visit | Attending: Obstetrics and Gynecology | Admitting: Obstetrics and Gynecology

## 2020-03-05 ENCOUNTER — Other Ambulatory Visit: Payer: Self-pay

## 2020-03-05 DIAGNOSIS — O99013 Anemia complicating pregnancy, third trimester: Secondary | ICD-10-CM | POA: Diagnosis present

## 2020-03-05 DIAGNOSIS — Z3A00 Weeks of gestation of pregnancy not specified: Secondary | ICD-10-CM | POA: Diagnosis not present

## 2020-03-05 DIAGNOSIS — D649 Anemia, unspecified: Secondary | ICD-10-CM | POA: Diagnosis not present

## 2020-03-05 MED ORDER — SODIUM CHLORIDE 0.9 % IV SOLN
510.0000 mg | INTRAVENOUS | Status: DC
  Administered 2020-03-05: 510 mg via INTRAVENOUS
  Filled 2020-03-05: qty 510

## 2020-03-11 ENCOUNTER — Ambulatory Visit (INDEPENDENT_AMBULATORY_CARE_PROVIDER_SITE_OTHER): Payer: Medicaid Other | Admitting: Women's Health

## 2020-03-11 ENCOUNTER — Other Ambulatory Visit: Payer: Self-pay

## 2020-03-11 VITALS — BP 127/81 | HR 109 | Wt 166.4 lb

## 2020-03-11 DIAGNOSIS — Z3403 Encounter for supervision of normal first pregnancy, third trimester: Secondary | ICD-10-CM

## 2020-03-11 DIAGNOSIS — O99013 Anemia complicating pregnancy, third trimester: Secondary | ICD-10-CM

## 2020-03-11 DIAGNOSIS — O26843 Uterine size-date discrepancy, third trimester: Secondary | ICD-10-CM

## 2020-03-11 NOTE — Patient Instructions (Signed)
Maternity Assessment Unit (MAU)  The Maternity Assessment Unit (MAU) is located at the [facility]. The address is: [address] [place], [place], [place] [address]. Please see map below for additional directions.    The Maternity Assessment Unit is designed to help you during your pregnancy, and for up to 6 weeks after delivery, with any pregnancy- or postpartum-related emergencies, if you think you are in labor, or if your water has broken. For example, if you experience nausea and vomiting, vaginal bleeding, severe abdominal or pelvic pain, elevated blood pressure or other problems related to your pregnancy or postpartum time, please come to the Maternity Assessment Unit for assistance.       AREA PEDIATRIC/FAMILY PRACTICE PHYSICIANS  [facility] [address] [place], [place] [address] Phone - [phone]   Fax - [place] [address] [place], [place]  [address] Phone - [phone]   Fax - [phone]  [place] [address] [place] [address] [place], [place]  [address] Phone - [phone]   Fax - [phone]  [facility] [address] [place], [place]  [address] Phone - [phone]   Fax - [phone]  [place] [address], [place], [place]  [address] Phone - [phone]   Fax - [place] [address] [place], [place]  [address] Phone - [phone]   Fax - [place] [facility] [place] [address], [place], [place]  [address] Phone - [phone]   Fax - [phone]  [place] [address] [place], [place], [place]  [address] Phone - [phone]   Fax - [place] [address] [place], [place]  [address] Phone - [phone]   Fax -  [phone] [facility] [place]. [address] [place], [place]  [address] Phone - [phone]   Fax - [phone]  [place] [address] [place], [place]  [address] Phone - [phone]   Fax - [phone]  [facility] [place], [place], [place]  [address] Phone - [phone]   Fax - [place] [address], [place], [place]  [address] Phone - [phone]   Fax - [phone]  [facility] [address], [place], [place]  [address] Phone - [place], [place]  [address] Phone - [phone]   Fax - [place] [address], [place], [place]  [address] Phone - [phone]   Fax - [phone]  [place] [address] [place], [place]  [address] Phone - [phone]   Fax - [place], [place]  [address] Phone - [phone]   Fax - [place], [place], [place]  [address] Phone - [phone]   Fax - [place] [address], [address] [place], [place]  [address] Phone - [phone]   Fax - [phone]  [place] RUBIN [address] [place], [place]  [address] Phone - [phone]   Fax - [place] [address] [place],   [address] Phone - [phone]  Fax - [phone]  [facility] [address] [place], [place]  [address] Phone - [phone]   Fax - [phone] Gerarda Fraction [phone] [address] [place], [place]  [address] Phone - [phone]   Fax - [phone]  [place] [address] [place], [place]  [address] Phone - [phone]   Fax - [phone]  [facility] [place] [address] [place], [place]  [address] Phone - [phone]   Fax - [phone]          Group B Streptococcus Test During Pregnancy Why am I having this test? Routine testing, also called screening, for group B streptococcus (GBS) is recommended for all pregnant women between the 36th and 37th week of pregnancy. GBS is a type of bacteria that can be passed from mother to baby during childbirth. Screening will help guide whether or not you will need treatment during labor and delivery to prevent complications such as:  An infection in your uterus during labor.  An infection in your uterus after delivery.  A serious infection in your baby after delivery, such as pneumonia, meningitis, or sepsis. GBS screening is not often done before 36 weeks of pregnancy unless you go into labor prematurely. What happens if I have group B streptococcus? If testing shows that you have GBS, your health care provider will recommend treatment with IV antibiotics during labor and delivery. This treatment significantly decreases the risk of complications for you and your baby. If you have a planned C-section and you have GBS, you may not need to be treated with antibiotics because GBS is usually passed to babies after labor starts and your water breaks. If you are in labor or your water breaks before your C-section, it is possible for GBS to get into your uterus and be passed to your baby, so you might need treatment. Is there a chance I may not need to be tested? You may not need to be tested for GBS if:  You have a urine test that shows GBS before 36 to 37 weeks.  You had a baby with GBS infection after a previous delivery. In these cases, you will automatically be treated for GBS during labor and delivery. What is being tested? This test is done to check if you have group B streptococcus in your vagina or rectum. What kind of sample is taken? To  collect samples for this test, your health care provider will swab your vagina and rectum with a cotton swab. The sample is then sent to the lab to see if GBS is present. What happens during the test?   You will remove your clothing from the waist down.  You will lie down on an exam table in the same position as you would for a pelvic exam.  Your health care provider will swab your vagina and rectum to collect samples for a culture test.  You will be able to go home after the test and do all your usual activities. How are the results reported? The test results are reported as positive or negative. What do the results mean?  A positive test means you are at risk for passing GBS to your baby during labor and delivery. Your health care provider will recommend that you are treated with an IV antibiotic during labor and delivery.  A negative test means you are at very low risk of passing GBS to your baby. There is still a low risk  of passing GBS to your baby because sometimes test results may report that you do not have a condition when you do (false-negative result) or there is a chance that you may become infected with GBS after the test is done. You most likely will not need to be treated with an antibiotic during labor and delivery. Talk with your health care provider about what your results mean. Questions to ask your health care provider Ask your health care provider, or the department that is doing the test:  When will my results be ready?  How will I get my results?  What are my treatment options? Summary  Routine testing (screening) for group B streptococcus (GBS) is recommended for all pregnant women between the 36th and 37th week of pregnancy.  GBS is a type of bacteria that can be passed from mother to baby during childbirth.  If testing shows that you have GBS, your health care provider will recommend that you are treated with IV antibiotics during labor and delivery. This  treatment almost always prevents infection in newborns. This information is not intended to replace advice given to you by your health care provider. Make sure you discuss any questions you have with your health care provider. Document Revised: 06/28/2018 Document Reviewed: 04/04/2018 Elsevier Patient Education  2020 [employer].        Third Trimester of Pregnancy The third trimester is from week 28 through week 40 (months 7 through 9). The third trimester is a time when the unborn baby (fetus) is growing rapidly. At the end of the ninth month, the fetus is about 20 inches in length and weighs 6-10 pounds. Body changes during your third trimester Your body will continue to go through many changes during pregnancy. The changes vary from woman to woman. During the third trimester:  Your weight will continue to increase. You can expect to gain 25-35 pounds (11-16 kg) by the end of the pregnancy.  You may begin to get stretch marks on your hips, abdomen, and breasts.  You may urinate more often because the fetus is moving lower into your pelvis and pressing on your bladder.  You may develop or continue to have heartburn. This is caused by increased hormones that slow down muscles in the digestive tract.  You may develop or continue to have constipation because increased hormones slow digestion and cause the muscles that push waste through your intestines to relax.  You may develop hemorrhoids. These are swollen veins (varicose veins) in the rectum that can itch or be painful.  You may develop swollen, bulging veins (varicose veins) in your legs.  You may have increased body aches in the pelvis, back, or thighs. This is due to weight gain and increased hormones that are relaxing your joints.  You may have changes in your hair. These can include thickening of your hair, rapid growth, and changes in texture. Some women also have hair loss during or after pregnancy, or hair that feels dry  or thin. Your hair will most likely return to normal after your baby is born.  Your breasts will continue to grow and they will continue to become tender. A yellow fluid (colostrum) may leak from your breasts. This is the first milk you are producing for your baby.  Your belly button may stick out.  You may notice more swelling in your hands, face, or ankles.  You may have increased tingling or numbness in your hands, arms, and legs. The skin on your belly may  also feel numb.  You may feel short of breath because of your expanding uterus.  You may have more problems sleeping. This can be caused by the size of your belly, increased need to urinate, and an increase in your body's metabolism.  You may notice the fetus "dropping," or moving lower in your abdomen (lightening).  You may have increased vaginal discharge.  You may notice your joints feel loose and you may have pain around your pelvic bone. What to expect at prenatal visits You will have prenatal exams every 2 weeks until week 36. Then you will have weekly prenatal exams. During a routine prenatal visit:  You will be weighed to make sure you and the baby are growing normally.  Your blood pressure will be taken.  Your abdomen will be measured to track your baby's growth.  The fetal heartbeat will be listened to.  Any test results from the previous visit will be discussed.  You may have a cervical check near your due date to see if your cervix has softened or thinned (effaced).  You will be tested for Group B streptococcus. This happens between 35 and 37 weeks. Your health care provider may ask you:  What your birth plan is.  How you are feeling.  If you are feeling the baby move.  If you have had any abnormal symptoms, such as leaking fluid, bleeding, severe headaches, or abdominal cramping.  If you are using any tobacco products, including cigarettes, chewing tobacco, and electronic cigarettes.  If you have any  questions. Other tests or screenings that may be performed during your third trimester include:  Blood tests that check for low iron levels (anemia).  Fetal testing to check the health, activity level, and growth of the fetus. Testing is done if you have certain medical conditions or if there are problems during the pregnancy.  Nonstress test (NST). This test checks the health of your baby to make sure there are no signs of problems, such as the baby not getting enough oxygen. During this test, a belt is placed around your belly. The baby is made to move, and its heart rate is monitored during movement. What is false labor? False labor is a condition in which you feel small, irregular tightenings of the muscles in the womb (contractions) that usually go away with rest, changing position, or drinking water. These are called Braxton Hicks contractions. Contractions may last for hours, days, or even weeks before true labor sets in. If contractions come at regular intervals, become more frequent, increase in intensity, or become painful, you should see your health care provider. What are the signs of labor?  Abdominal cramps.  Regular contractions that start at 10 minutes apart and become stronger and more frequent with time.  Contractions that start on the top of the uterus and spread down to the lower abdomen and back.  Increased pelvic pressure and dull back pain.  A watery or bloody mucus discharge that comes from the vagina.  Leaking of amniotic fluid. This is also known as your "water breaking." It could be a slow trickle or a gush. Let your health care provider know if it has a color or strange odor. If you have any of these signs, call your health care provider right away, even if it is before your due date. Follow these instructions at home: Medicines  Follow your health care provider's instructions regarding medicine use. Specific medicines may be either safe or unsafe to take during  pregnancy.  Take a prenatal vitamin that contains at least 600 micrograms (mcg) of folic acid.  If you develop constipation, try taking a stool softener if your health care provider approves. Eating and drinking   Eat a balanced diet that includes fresh fruits and vegetables, whole grains, good sources of protein such as meat, eggs, or tofu, and low-fat dairy. Your health care provider will help you determine the amount of weight gain that is right for you.  Avoid raw meat and uncooked cheese. These carry germs that can cause birth defects in the baby.  If you have low calcium intake from food, talk to your health care provider about whether you should take a daily calcium supplement.  Eat four or five small meals rather than three large meals a day.  Limit foods that are high in fat and processed sugars, such as fried and sweet foods.  To prevent constipation: ? Drink enough fluid to keep your urine clear or pale yellow. ? Eat foods that are high in fiber, such as fresh fruits and vegetables, whole grains, and beans. Activity  Exercise only as directed by your health care provider. Most women can continue their usual exercise routine during pregnancy. Try to exercise for 30 minutes at least 5 days a week. Stop exercising if you experience uterine contractions.  Avoid heavy lifting.  Do not exercise in extreme heat or humidity, or at high altitudes.  Wear low-heel, comfortable shoes.  Practice good posture.  You may continue to have sex unless your health care provider tells you otherwise. Relieving pain and discomfort  Take frequent breaks and rest with your legs elevated if you have leg cramps or low back pain.  Take warm sitz baths to soothe any pain or discomfort caused by hemorrhoids. Use hemorrhoid cream if your health care provider approves.  Wear a good support bra to prevent discomfort from breast tenderness.  If you develop varicose veins: ? Wear support pantyhose  or compression stockings as told by your healthcare provider. ? Elevate your feet for 15 minutes, 3-4 times a day. Prenatal care  Write down your questions. Take them to your prenatal visits.  Keep all your prenatal visits as told by your health care provider. This is important. Safety  Wear your seat belt at all times when driving.  Make a list of emergency phone numbers, including numbers for family, friends, the hospital, and police and fire departments. General instructions  Avoid cat litter boxes and soil used by cats. These carry germs that can cause birth defects in the baby. If you have a cat, ask someone to clean the litter box for you.  Do not travel far distances unless it is absolutely necessary and only with the approval of your health care provider.  Do not use hot tubs, steam rooms, or saunas.  Do not drink alcohol.  Do not use any products that contain nicotine or tobacco, such as cigarettes and e-cigarettes. If you need help quitting, ask your health care provider.  Do not use any medicinal herbs or unprescribed drugs. These chemicals affect the formation and growth of the baby.  Do not douche or use tampons or scented sanitary pads.  Do not cross your legs for long periods of time.  To prepare for the arrival of your baby: ? Take prenatal classes to understand, practice, and ask questions about labor and delivery. ? Make a trial run to the hospital. ? Visit the hospital and tour the maternity area. ? Arrange for maternity  or paternity leave through employers. ? Arrange for family and friends to take care of pets while you are in the hospital. ? Purchase a rear-facing car seat and make sure you know how to install it in your car. ? Pack your hospital bag. ? Prepare the baby's nursery. Make sure to remove all pillows and stuffed animals from the baby's crib to prevent suffocation.  Visit your dentist if you have not gone during your pregnancy. Use a soft  toothbrush to brush your teeth and be gentle when you floss. Contact a health care provider if:  You are unsure if you are in labor or if your water has broken.  You become dizzy.  You have mild pelvic cramps, pelvic pressure, or nagging pain in your abdominal area.  You have lower back pain.  You have persistent nausea, vomiting, or diarrhea.  You have an unusual or bad smelling vaginal discharge.  You have pain when you urinate. Get help right away if:  Your water breaks before 37 weeks.  You have regular contractions less than 5 minutes apart before 37 weeks.  You have a fever.  You are leaking fluid from your vagina.  You have spotting or bleeding from your vagina.  You have severe abdominal pain or cramping.  You have rapid weight loss or weight gain.  You have shortness of breath with chest pain.  You notice sudden or extreme swelling of your face, hands, ankles, feet, or legs.  Your baby makes fewer than 10 movements in 2 hours.  You have severe headaches that do not go away when you take medicine.  You have vision changes. Summary  The third trimester is from week 28 through week 40, months 7 through 9. The third trimester is a time when the unborn baby (fetus) is growing rapidly.  During the third trimester, your discomfort may increase as you and your baby continue to gain weight. You may have abdominal, leg, and back pain, sleeping problems, and an increased need to urinate.  During the third trimester your breasts will keep growing and they will continue to become tender. A yellow fluid (colostrum) may leak from your breasts. This is the first milk you are producing for your baby.  False labor is a condition in which you feel small, irregular tightenings of the muscles in the womb (contractions) that eventually go away. These are called Braxton Hicks contractions. Contractions may last for hours, days, or even weeks before true labor sets in.  Signs of  labor can include: abdominal cramps; regular contractions that start at 10 minutes apart and become stronger and more frequent with time; watery or bloody mucus discharge that comes from the vagina; increased pelvic pressure and dull back pain; and leaking of amniotic fluid. This information is not intended to replace advice given to you by your health care provider. Make sure you discuss any questions you have with your health care provider. Document Revised: 06/28/2018 Document Reviewed: 04/12/2016 Elsevier Patient Education  2020 Else[employer] Inc[employer]        Pregnancy and Anemia  Anemia is a condition in which the concentration of red blood cells, or hemoglobin, in the blood is below normal. Hemoglobin is a substance in red blood cells that carries oxygen to the tissues of the body. Anemia results when enough oxygen does not reach these tissues. Anemia is common during pregnancy because the woman's body needs more blood volume and blood cells to provide nutrition to the fetus. The fetus needs iron  and folic acid as it is developing. Your body may not produce enough red blood cells because of this. Also, during pregnancy, the liquid part of the blood (plasma) increases by about 30-50%, and the red blood cells increase by only 20%. This lowers the concentration of the red blood cells and creates a natural anemia-like situation. What are the causes? The most common cause of anemia during pregnancy is not having enough iron in the body to make red blood cells (iron deficiency anemia). Other causes may include:  Folic acid deficiency.  Vitamin B12 deficiency.  Certain prescription or over-the-counter medicines.  Certain medical conditions or infections that destroy red blood cells.  A low platelet count and bleeding caused by antibodies that go through the placenta to the fetus from the mother's blood. What are the signs or symptoms? Mild anemia may not be noticeable. If it becomes severe,  symptoms may include:  Feeling tired (fatigue).  Shortness of breath, especially during activity.  Weakness.  Fainting.  Pale looking skin.  Headaches.  A fast or irregular heartbeat (palpitations).  Dizziness. How is this diagnosed? This condition may be diagnosed based on:  Your medical history and a physical exam.  Blood tests. How is this treated? Treatment for anemia during pregnancy depends on the cause of the anemia. Treatment can include:  Dietary changes.  Supplements of iron, vitamin B12, or folic acid.  A blood transfusion. This may be needed if anemia is severe.  Hospitalization. This may be needed if there is a lot of blood loss or severe anemia. Follow these instructions at home:  Follow recommendations from your dietitian or health care provider about changing your diet.  Increase your vitamin C intake. This will help the stomach absorb more iron. Some foods that are high in vitamin C include: ? Oranges. ? Peppers. ? Tomatoes. ? Mangoes.  Eat a diet rich in iron. This would include foods such as: ? Liver. ? Beef. ? Eggs. ? Whole grains. ? Spinach. ? Dried fruit.  Take iron and vitamins as told by your health care provider.  Eat green leafy vegetables. These are a good source of folic acid.  Keep all follow-up visits as told by your health care provider. This is important. Contact a health care provider if:  You have frequent or lasting headaches.  You look pale.  You bruise easily. Get help right away if:  You have extreme weakness, shortness of breath, or chest pain.  You become dizzy or have trouble concentrating.  You have heavy vaginal bleeding.  You develop a rash.  You have bloody or black, tarry stools.  You faint.  You vomit up blood.  You vomit repeatedly.  You have abdominal pain.  You have a fever.  You are dehydrated. Summary  Anemia is a condition in which the concentration of red blood cells or  hemoglobin in the blood is below normal.  Anemia is common during pregnancy because the woman's body needs more blood volume and blood cells to provide nutrition to the fetus.  The most common cause of anemia during pregnancy is not having enough iron in the body to make red blood cells (iron deficiency anemia).  Mild anemia may not be noticeable. If it becomes severe, symptoms may include feeling tired and weak. This information is not intended to replace advice given to you by your health care provider. Make sure you discuss any questions you have with your health care provider. Document Revised: 08/21/2018 Document Reviewed: 04/12/2016 Elsevier  Patient Education  [employer].

## 2020-03-11 NOTE — Progress Notes (Signed)
Pt reports fetal movement, denies pain.  

## 2020-03-11 NOTE — Progress Notes (Signed)
Subjective:  Ashley Graham is a 19 y.o. G1P0 at [redacted]w[redacted]d being seen today for ongoing prenatal care.  She is currently monitored for the following issues for this low-risk pregnancy and has Supervision of normal first teen pregnancy and Anemia in pregnancy on their problem list.  Patient reports no complaints.  Contractions: Not present. Vag. Bleeding: None.  Movement: Present. Denies leaking of fluid.   The following portions of the patient's history were reviewed and updated as appropriate: allergies, current medications, past family history, past medical history, past social history, past surgical history and problem list. Problem list updated.  Objective:   Vitals:   03/11/20 1559  BP: 127/81  Pulse: (!) 109  Weight: 166 lb 6.4 oz (75.5 kg)    Fetal Status: Fetal Heart Rate (bpm): 146 Fundal Height: 39 cm Movement: Present     General:  Alert, oriented and cooperative. Patient is in no acute distress.  Skin: Skin is warm and dry. No rash noted.   Cardiovascular: Normal heart rate noted  Respiratory: Normal respiratory effort, no problems with respiration noted  Abdomen: Soft, gravid, appropriate for gestational age. Pain/Pressure: Absent     Pelvic: Vag. Bleeding: None     Cervical exam deferred        Extremities: Normal range of motion.  Edema: None  Mental Status: Normal mood and affect. Normal behavior. Normal judgment and thought content.   Urinalysis:      Assessment and Plan:  Pregnancy: G1P0 at [redacted]w[redacted]d  1. Supervision of normal first teen pregnancy in third trimester -peds list given -GBS next visit  2. Anemia during pregnancy in third trimester -fereheme 03/05/2020, 2nd appt 03/12/2020  3. Uterine size-date discrepancy in third trimester - [place] MFM OB FOLLOW UP; Future  Preterm labor symptoms and general obstetric precautions including but not limited to vaginal bleeding, contractions, leaking of fluid and fetal movement were reviewed in detail with the patient. I  discussed the assessment and treatment plan with the patient. The patient was provided an opportunity to ask questions and all were answered. The patient agreed with the plan and demonstrated an understanding of the instructions. The patient was advised to call back or seek an in-person office evaluation/go to [facility]U at [facility] for any urgent or concerning symptoms. Please refer to After Visit Summary for other counseling recommendations.  Return in about 2 weeks (around 03/25/2020) for in-person LOB/APP OK/GBS/cultures, needs growth [place].   Gaynel Schaafsma, Odie Sera, NP

## 2020-03-12 ENCOUNTER — Encounter (HOSPITAL_COMMUNITY)
Admission: RE | Admit: 2020-03-12 | Discharge: 2020-03-12 | Disposition: A | Discharge status: Home / Self Care | Payer: Medicaid Other | Source: Ambulatory Visit | Attending: Obstetrics and Gynecology | Admitting: Obstetrics and Gynecology

## 2020-03-12 DIAGNOSIS — O99013 Anemia complicating pregnancy, third trimester: Secondary | ICD-10-CM | POA: Diagnosis not present

## 2020-03-12 MED ORDER — SODIUM CHLORIDE 0.9 % IV SOLN
510.0000 mg | INTRAVENOUS | Status: DC
  Administered 2020-03-12: 510 mg via INTRAVENOUS
  Filled 2020-03-12: qty 17

## 2020-03-17 ENCOUNTER — Ambulatory Visit: Payer: Medicaid Other | Attending: Women's Health

## 2020-03-17 ENCOUNTER — Ambulatory Visit: Payer: Medicaid Other | Admitting: *Deleted

## 2020-03-17 ENCOUNTER — Other Ambulatory Visit: Payer: Self-pay

## 2020-03-17 ENCOUNTER — Encounter: Payer: Self-pay | Admitting: *Deleted

## 2020-03-17 VITALS — BP 126/66 | HR 96

## 2020-03-17 DIAGNOSIS — O26843 Uterine size-date discrepancy, third trimester: Secondary | ICD-10-CM | POA: Diagnosis not present

## 2020-03-17 DIAGNOSIS — Z3A35 35 weeks gestation of pregnancy: Secondary | ICD-10-CM | POA: Diagnosis not present

## 2020-03-17 DIAGNOSIS — Z363 Encounter for antenatal screening for malformations: Secondary | ICD-10-CM

## 2020-03-17 DIAGNOSIS — O99013 Anemia complicating pregnancy, third trimester: Secondary | ICD-10-CM | POA: Diagnosis present

## 2020-03-21 NOTE — L&D Delivery Note (Signed)
OB/GYN Faculty Practice Delivery Note  Ashley Graham is a 20 y.o. G1P0 s/p SVD at [redacted]w[redacted]d. She was admitted for early labor.   ROM: 8h 71m with clear fluid GBS Status:  Negative/-- (01/05 1143) Maximum Maternal Temperature: 102.4*F at time of delivery, antibiotics ordered after delivery   Labor Progress: . Initial SVE: 4cm. She then progressed to complete.   Delivery Date/Time: 2/1 at 18:45 Delivery: Called to room and patient was complete and pushing. Head delivered ROP. No nuchal cord present. Shoulder and body delivered in usual fashion. Infant with spontaneous cry, placed on mother's abdomen, dried and stimulated. Cord clamped x 2 after 1-minute delay, and cut by patient's mother. Cord blood drawn. Placenta delivered spontaneously with gentle cord traction. Fundus firm with massage and Pitocin. Labia, perineum, vagina, and cervix inspected inspected with second degree perineal and left labial laceration.   Baby Weight: pending  Placenta: Sent to L&D Complications: Triple I diagnosed at time of delivery based on maternal temp 102.  Lacerations: second degree laceration & left labial laceration, repaired EBL: 200 mL Analgesia: Epidural   Infant:  APGAR (1 MIN): 6   APGAR (5 MINS): 9   APGAR (10 MINS):     Casper Harrison, MD [facility] Family Medicine Fellow, [facility], [facility] 04/21/2020, 7:29 PM

## 2020-03-23 ENCOUNTER — Encounter: Payer: Self-pay | Admitting: Women's Health

## 2020-03-23 DIAGNOSIS — O26849 Uterine size-date discrepancy, unspecified trimester: Secondary | ICD-10-CM | POA: Insufficient documentation

## 2020-03-23 HISTORY — DX: Uterine size-date discrepancy, unspecified trimester: O26.849

## 2020-03-25 ENCOUNTER — Other Ambulatory Visit (HOSPITAL_COMMUNITY)
Admission: RE | Admit: 2020-03-25 | Discharge: 2020-03-25 | Disposition: A | Discharge status: Home / Self Care | Payer: Medicaid Other | Source: Ambulatory Visit | Attending: Obstetrics and Gynecology | Admitting: Obstetrics and Gynecology

## 2020-03-25 ENCOUNTER — Other Ambulatory Visit: Payer: Self-pay

## 2020-03-25 ENCOUNTER — Encounter: Payer: Self-pay | Admitting: Obstetrics and Gynecology

## 2020-03-25 ENCOUNTER — Ambulatory Visit (INDEPENDENT_AMBULATORY_CARE_PROVIDER_SITE_OTHER): Payer: Medicaid Other | Admitting: Obstetrics and Gynecology

## 2020-03-25 VITALS — BP 119/76 | HR 97 | Wt 166.0 lb

## 2020-03-25 DIAGNOSIS — Z3403 Encounter for supervision of normal first pregnancy, third trimester: Secondary | ICD-10-CM

## 2020-03-25 DIAGNOSIS — O99013 Anemia complicating pregnancy, third trimester: Secondary | ICD-10-CM

## 2020-03-25 DIAGNOSIS — O26843 Uterine size-date discrepancy, third trimester: Secondary | ICD-10-CM

## 2020-03-25 DIAGNOSIS — Z3A36 36 weeks gestation of pregnancy: Secondary | ICD-10-CM

## 2020-03-25 NOTE — Patient Instructions (Signed)

## 2020-03-25 NOTE — Progress Notes (Addendum)
   LOW-RISK PREGNANCY OFFICE VISIT Patient name: Ashley Graham MRN [id number]  Date of birth: 2001-01-31 Chief Complaint:   Routine Prenatal Visit  History of Present Illness:   Ashley Graham is a 20 y.o. G1P0 female at [redacted]w[redacted]d with an Estimated Date of Delivery: 04/19/20 being seen today for ongoing management of a low-risk pregnancy.  Today she reports no complaints. Contractions: Not present. Vag. Bleeding: None.  Movement: Present. denies leaking of fluid. Review of Systems:   Pertinent items are noted in HPI Denies abnormal vaginal discharge w/ itching/odor/irritation, headaches, visual changes, shortness of breath, chest pain, abdominal pain, severe nausea/vomiting, or problems with urination or bowel movements unless otherwise stated above. Pertinent History Reviewed:  Reviewed past medical,surgical, social, obstetrical and family history.  Reviewed problem list, medications and allergies. Physical Assessment:   Vitals:   03/25/20 1007  BP: 119/76  Pulse: 97  Weight: 166 lb (75.3 kg)  Body mass index is 30.36 kg/m.        Physical Examination:   General appearance: Well appearing, and in no distress  Mental status: Alert, oriented to person, place, and time  Skin: Warm & dry  Cardiovascular: Normal heart rate noted  Respiratory: Normal respiratory effort, no distress  Abdomen: Soft, gravid, nontender  Pelvic: Cervical exam deferred Patient declined  chaperone present for entire pelvic exam/testing        Extremities: Edema: Trace  Fetal Status: Fetal Heart Rate (bpm): 147 Fundal Height: 37 cm Movement: Present    No results found for this or any previous visit (from the past 24 hour(s)).  Assessment & Plan:  1) Low-risk pregnancy G1P0 at [redacted]w[redacted]d with an Estimated Date of Delivery: 04/19/20   2) Supervision of normal first teen pregnancy in third trimester - Anticipatory guidance for GBS screening at 36 wks. Explained the test is important to be done at this time in  pregnancy to ensure adequate treatment at the time of delivery. Explained that a positive result does not mean any harm to her, but can be harmful to the baby. Meaning that if baby is exposed to the bacteria for too long without antibiotics, the bay has the potential to develop pneumonia, septicemia, or spinal meningitis and could end up in the NICU. Also, explained that a cervical exam may be performed at the time of testing to get a baseline cervical check and make sure there is no preterm cervical dilation. - Patient declined cervical exam not understanding what dilation or cervical exam meant, after explanation. - GBS - GC/CT  3) Uterine size-date discrepancy in third trimester - Normal growth and fluid on U/S on 03/17/2020  4) [redacted] weeks gestation of pregnancy    Meds: No orders of the defined types were placed in this encounter.  Labs/procedures today: GBS and GC/CT  Plan:  Continue routine obstetrical care   Reviewed: Preterm labor symptoms and general obstetric precautions including but not limited to vaginal bleeding, contractions, leaking of fluid and fetal movement were reviewed in detail with the patient.  All questions were answered. Has home bp cuff. Check bp weekly, let us know if >140/90.   Follow-up: Return in about 1 week (around 04/01/2020) for Return OB visit.  Orders Placed This Encounter  Procedures  . Strep Gp B NAA   Raelyn Mora MSN, CNM 03/25/2020 10:33 AM

## 2020-03-25 NOTE — Progress Notes (Signed)
ROB 36 wks   CC: None   Pt declined cervix check today.

## 2020-03-26 LAB — CERVICOVAGINAL ANCILLARY ONLY
Chlamydia: NEGATIVE
Comment: NEGATIVE
Comment: NEGATIVE
Comment: NORMAL
Neisseria Gonorrhea: NEGATIVE
Trichomonas: NEGATIVE

## 2020-03-27 LAB — STREP GP B NAA: Strep Gp B NAA: NEGATIVE

## 2020-04-01 ENCOUNTER — Encounter: Payer: Medicaid Other | Admitting: Women's Health

## 2020-04-08 ENCOUNTER — Encounter: Payer: Medicaid Other | Admitting: Nurse Practitioner

## 2020-04-15 ENCOUNTER — Other Ambulatory Visit: Payer: Self-pay

## 2020-04-15 ENCOUNTER — Ambulatory Visit (INDEPENDENT_AMBULATORY_CARE_PROVIDER_SITE_OTHER): Payer: Medicaid Other | Admitting: Certified Nurse Midwife

## 2020-04-15 VITALS — BP 132/81 | HR 80 | Wt 179.8 lb

## 2020-04-15 DIAGNOSIS — O2603 Excessive weight gain in pregnancy, third trimester: Secondary | ICD-10-CM

## 2020-04-15 DIAGNOSIS — Z3A39 39 weeks gestation of pregnancy: Secondary | ICD-10-CM

## 2020-04-15 DIAGNOSIS — Z3403 Encounter for supervision of normal first pregnancy, third trimester: Secondary | ICD-10-CM

## 2020-04-15 DIAGNOSIS — O2600 Excessive weight gain in pregnancy, unspecified trimester: Secondary | ICD-10-CM | POA: Insufficient documentation

## 2020-04-15 NOTE — Patient Instructions (Signed)
Rosen's Emergency Medicine: Concepts and Clinical Practice (9th ed., pp. 2296- 2312). Elsevier.">  Braxton Hicks Contractions Contractions of the uterus can occur throughout pregnancy, but they are not always a sign that you are in labor. You may have practice contractions called Braxton Hicks contractions. These false labor contractions are sometimes confused with true labor. What are Braxton Hicks contractions? Braxton Hicks contractions are tightening movements that occur in the muscles of the uterus before labor. Unlike true labor contractions, these contractions do not result in opening (dilation) and thinning of the cervix. Toward the end of pregnancy (32-34 weeks), Braxton Hicks contractions can happen more often and may become stronger. These contractions are sometimes difficult to tell apart from true labor because they can be very uncomfortable. You should not feel embarrassed if you go to the hospital with false labor. Sometimes, the only way to tell if you are in true labor is for your health care provider to look for changes in the cervix. The health care provider will do a physical exam and may monitor your contractions. If you are not in true labor, the exam should show that your cervix is not dilating and your water has not broken. If there are no other health problems associated with your pregnancy, it is completely safe for you to be sent home with false labor. You may continue to have Braxton Hicks contractions until you go into true labor. How to tell the difference between true labor and false labor True labor  Contractions last 30-70 seconds.  Contractions become very regular.  Discomfort is usually felt in the top of the uterus, and it spreads to the lower abdomen and low back.  Contractions do not go away with walking.  Contractions usually become more intense and increase in frequency.  The cervix dilates and gets thinner. False labor  Contractions are usually shorter  and not as strong as true labor contractions.  Contractions are usually irregular.  Contractions are often felt in the front of the lower abdomen and in the groin.  Contractions may go away when you walk around or change positions while lying down.  Contractions get weaker and are shorter-lasting as time goes on.  The cervix usually does not dilate or become thin. Follow these instructions at home:  Take over-the-counter and prescription medicines only as told by your health care provider.  Keep up with your usual exercises and follow other instructions from your health care provider.  Eat and drink lightly if you think you are going into labor.  If Braxton Hicks contractions are making you uncomfortable: ? Change your position from lying down or resting to walking, or change from walking to resting. ? Sit and rest in a tub of warm water. ? Drink enough fluid to keep your urine pale yellow. Dehydration may cause these contractions. ? Do slow and deep breathing several times an hour.  Keep all follow-up prenatal visits as told by your health care provider. This is important.   Contact a health care provider if:  You have a fever.  You have continuous pain in your abdomen. Get help right away if:  Your contractions become stronger, more regular, and closer together.  You have fluid leaking or gushing from your vagina.  You pass blood-tinged mucus (bloody show).  You have bleeding from your vagina.  You have low back pain that you never had before.  You feel your baby's head pushing down and causing pelvic pressure.  Your baby is not moving inside   you as much as it used to. Summary  Contractions that occur before labor are called Braxton Hicks contractions, false labor, or practice contractions.  Braxton Hicks contractions are usually shorter, weaker, farther apart, and less regular than true labor contractions. True labor contractions usually become progressively  stronger and regular, and they become more frequent.  Manage discomfort from Braxton Hicks contractions by changing position, resting in a warm bath, drinking plenty of water, or practicing deep breathing. This information is not intended to replace advice given to you by your health care provider. Make sure you discuss any questions you have with your health care provider. Document Revised: 02/17/2017 Document Reviewed: 07/21/2016 Elsevier Patient Education  2021 Elsevier Inc.   Fetal Movement Counts Patient Name: ________________________________________________ Patient Due Date: ____________________  What is a fetal movement count? A fetal movement count is the number of times that you feel your baby move during a certain amount of time. This may also be called a fetal kick count. A fetal movement count is recommended for every pregnant woman. You may be asked to start counting fetal movements as early as week 28 of your pregnancy. Pay attention to when your baby is most active. You may notice your baby's sleep and wake cycles. You may also notice things that make your baby move more. You should do a fetal movement count:  When your baby is normally most active.  At the same time each day. A good time to count movements is while you are resting, after having something to eat and drink. How do I count fetal movements? 1. Find a quiet, comfortable area. Sit, or lie down on your side. 2. Write down the date, the start time and stop time, and the number of movements that you felt between those two times. Take this information with you to your health care visits. 3. Write down your start time when you feel the first movement. 4. Count kicks, flutters, swishes, rolls, and jabs. You should feel at least 10 movements. 5. You may stop counting after you have felt 10 movements, or if you have been counting for 2 hours. Write down the stop time. 6. If you do not feel 10 movements in 2 hours, contact  your health care provider for further instructions. Your health care provider may want to do additional tests to assess your baby's well-being. Contact a health care provider if:  You feel fewer than 10 movements in 2 hours.  Your baby is not moving like he or she usually does. Date: ____________ Start time: ____________ Stop time: ____________ Movements: ____________ Date: ____________ Start time: ____________ Stop time: ____________ Movements: ____________ Date: ____________ Start time: ____________ Stop time: ____________ Movements: ____________ Date: ____________ Start time: ____________ Stop time: ____________ Movements: ____________ Date: ____________ Start time: ____________ Stop time: ____________ Movements: ____________ Date: ____________ Start time: ____________ Stop time: ____________ Movements: ____________ Date: ____________ Start time: ____________ Stop time: ____________ Movements: ____________ Date: ____________ Start time: ____________ Stop time: ____________ Movements: ____________ Date: ____________ Start time: ____________ Stop time: ____________ Movements: ____________ This information is not intended to replace advice given to you by your health care provider. Make sure you discuss any questions you have with your health care provider. Document Revised: 10/25/2018 Document Reviewed: 10/25/2018 Elsevier Patient Education  2021 Elsevier Inc.  

## 2020-04-15 NOTE — Progress Notes (Signed)
ROB 39w   CC: None   Cervix exam declined.

## 2020-04-15 NOTE — Progress Notes (Signed)
Subjective:  Ashley Graham is a 20 y.o. G1P0 at [redacted]w[redacted]d being seen today for ongoing prenatal care.  She is currently monitored for the following issues for this low-risk pregnancy and has Supervision of normal first teen pregnancy; Anemia in pregnancy; Uterine size date discrepancy; and Excess weight gain in pregnancy on their problem list.  Patient reports no complaints.  Contractions: Not present. Vag. Bleeding: None.  Movement: Present. Denies leaking of fluid.   The following portions of the patient's history were reviewed and updated as appropriate: allergies, current medications, past family history, past medical history, past social history, past surgical history and problem list. Problem list updated.  Objective:   Vitals:   04/15/20 1407  BP: 132/81  Pulse: 80  Weight: 179 lb 12.8 oz (81.6 kg)    Fetal Status: Fetal Heart Rate (bpm): 142 Fundal Height: 39 cm Movement: Present  Presentation: Vertex  General:  Alert, oriented and cooperative. Patient is in no acute distress.  Skin: Skin is warm and dry. No rash noted.   Cardiovascular: Normal heart rate noted  Respiratory: Normal respiratory effort, no problems with respiration noted  Abdomen: Soft, gravid, appropriate for gestational age. Pain/Pressure: Absent     Pelvic: Vag. Bleeding: None     Cervical exam deferred        Extremities: Normal range of motion.  Edema: Trace  Mental Status: Normal mood and affect. Normal behavior. Normal judgment and thought content.   Urinalysis:      Assessment and Plan:  Pregnancy: G1P0 at [redacted]w[redacted]d  1. Supervision of normal first teen pregnancy in third trimester - NST and AFI next week - IOL at 41 week  2. Excessive weight gain during pregnancy in third trimester - 54 lb TWG  3. [redacted] weeks gestation   Preterm labor symptoms and general obstetric precautions including but not limited to vaginal bleeding, contractions, leaking of fluid and fetal movement were reviewed in detail with  the patient. Please refer to After Visit Summary for other counseling recommendations.  Return in about 1 week (around 04/22/2020).   Donette Larry, CNM

## 2020-04-20 ENCOUNTER — Inpatient Hospital Stay (HOSPITAL_COMMUNITY)
Admission: AD | Admit: 2020-04-20 | Discharge: 2020-04-20 | Disposition: A | Discharge status: Home / Self Care | Payer: Medicaid Other | Source: Home / Self Care | Attending: Family Medicine | Admitting: Family Medicine

## 2020-04-20 ENCOUNTER — Other Ambulatory Visit: Payer: Self-pay

## 2020-04-20 ENCOUNTER — Encounter (HOSPITAL_COMMUNITY): Payer: Self-pay

## 2020-04-20 ENCOUNTER — Inpatient Hospital Stay (HOSPITAL_COMMUNITY)
Admission: AD | Admit: 2020-04-20 | Discharge: 2020-04-23 | DRG: 805 | Disposition: A | Discharge status: Home / Self Care | Payer: Medicaid Other | Attending: Family Medicine | Admitting: Family Medicine

## 2020-04-20 DIAGNOSIS — Z3A40 40 weeks gestation of pregnancy: Secondary | ICD-10-CM

## 2020-04-20 DIAGNOSIS — Z3689 Encounter for other specified antenatal screening: Secondary | ICD-10-CM

## 2020-04-20 DIAGNOSIS — O99019 Anemia complicating pregnancy, unspecified trimester: Secondary | ICD-10-CM | POA: Diagnosis present

## 2020-04-20 DIAGNOSIS — O411230 Chorioamnionitis, third trimester, not applicable or unspecified: Secondary | ICD-10-CM | POA: Diagnosis present

## 2020-04-20 DIAGNOSIS — O139 Gestational [pregnancy-induced] hypertension without significant proteinuria, unspecified trimester: Secondary | ICD-10-CM | POA: Diagnosis present

## 2020-04-20 DIAGNOSIS — Z23 Encounter for immunization: Secondary | ICD-10-CM

## 2020-04-20 DIAGNOSIS — Z3400 Encounter for supervision of normal first pregnancy, unspecified trimester: Secondary | ICD-10-CM

## 2020-04-20 DIAGNOSIS — Z20822 Contact with and (suspected) exposure to covid-19: Secondary | ICD-10-CM | POA: Diagnosis present

## 2020-04-20 DIAGNOSIS — O9902 Anemia complicating childbirth: Secondary | ICD-10-CM | POA: Diagnosis present

## 2020-04-20 DIAGNOSIS — O471 False labor at or after 37 completed weeks of gestation: Secondary | ICD-10-CM

## 2020-04-20 DIAGNOSIS — D649 Anemia, unspecified: Secondary | ICD-10-CM | POA: Diagnosis present

## 2020-04-20 DIAGNOSIS — O411290 Chorioamnionitis, unspecified trimester, not applicable or unspecified: Secondary | ICD-10-CM | POA: Diagnosis not present

## 2020-04-20 DIAGNOSIS — O134 Gestational [pregnancy-induced] hypertension without significant proteinuria, complicating childbirth: Principal | ICD-10-CM | POA: Diagnosis present

## 2020-04-20 DIAGNOSIS — O26849 Uterine size-date discrepancy, unspecified trimester: Secondary | ICD-10-CM | POA: Diagnosis present

## 2020-04-20 NOTE — MAU Note (Signed)
I have communicated with Dr. Leary Roca and reviewed vital signs:  Vitals:   04/20/20 0848 04/20/20 1031  BP: 133/81 132/75  Pulse: (!) 101 93  Resp:  17  Temp:    SpO2:      Vaginal exam:  Dilation: Closed Effacement (%): Thick Cervical Position: Posterior Exam by:: Latricia Heft, RN,   Also reviewed contraction pattern and that non-stress test is reactive.  It has been documented that patient is contracting every 2-5 minutes with no cervical change over 1.5 hours not indicating active labor.  Patient denies any other complaints.  Based on this report provider has given order for discharge.  A discharge order and diagnosis entered by a provider.   Labor discharge instructions reviewed with patient.

## 2020-04-20 NOTE — MAU Note (Signed)
Patient reports CTX 10-15 mins apart.  Denies LOF.  Some bloody show.  Endorses + FM.  No VE yet.

## 2020-04-20 NOTE — Discharge Instructions (Signed)
First Stage of Labor Labor is your body's natural process of moving your baby and other structures, including the placenta and umbilical cord, out of your uterus. There are three stages of labor. How long each stage lasts is different for every woman. But certain events happen during each stage that are the same for everyone.  The first stage starts when true labor begins. This stage ends when your cervix, which is the opening from your uterus into your vagina, is completely open (dilated).  The second stage begins when your cervix is fully dilated and you start pushing. This stage ends when your baby is born.  The third stage is the delivery of the organ that nourished your baby during pregnancy (placenta). First stage of labor As your due date gets closer, you may start to notice certain physical changes that mean labor is going to start soon. You may feel that your baby has dropped lower into your pelvis. You may experience irregular, often painless, contractions that go away when you walk around or lie down (Braxton Hicks contractions). This is also called false labor. The first stage of labor begins when you start having contractions that come at regular (evenly spaced) intervals and your cervix starts to get thinner and wider in preparation for your baby to pass through. Birth care providers measure the dilation of your cervix in centimeters (cm). One centimeter is a little less than one-half of an inch. The first stage ends when your cervix is dilated to 10 cm. The first stage of labor is divided into three phases:  Early phase.  Active phase.  Transitional phase. The length of the first stage of labor varies. It may be longer if this is your first pregnancy. You may spend most of this stage at home trying to relax and stay comfortable. How does this affect me? During the first stage of labor, you will move through three phases. What happens in the early phase?  You will start to have  regular contractions that last 30-60 seconds. Contractions may come every 5-20 minutes. Keep track of your contractions and call your birth care provider.  Your water may break during this phase.  You may notice a clear or slightly bloody discharge of mucus (mucus plug) from your vagina.  Your cervix will dilate to 3-6 cm. What happens in the active phase? The active phase usually lasts 3-5 hours. You may go to the hospital or birth center around this time. During the active phase:  Your contractions will become stronger, longer, and more uncomfortable.  Your contractions may last 45-90 seconds and come every 3-5 minutes.  You may feel lower back pain.  Your birth care providers may examine your cervix and feel your belly to find the position of your baby.  You may have a monitor strapped to your belly to measure your contractions and your baby's heart rate.  You may start using your pain management options.  Your cervix may be dilated to 6 cm and may start to dilate more quickly. What happens in the transitional phase? The transitional phase typically lasts from 30 minutes to 2 hours. At the end of this phase, your cervix will be fully dilated to 10 cm. During the transitional phase:  Contractions will get stronger and longer.  Contractions may last 60-90 seconds and come less than 2 minutes apart.  You may feel hot flashes, chills, or nausea. How does this affect my baby? During the first stage of labor, your baby will   gradually move down into your birth canal. Follow these instructions at home and in the hospital or birth center:  When labor first begins, try to stay calm. You are still in the early phase. If it is night, try to get some sleep. If it is day, try to relax and save your energy. You may want to make some calls and get ready to go to the hospital or birth center.  When you are in the early phase, try these methods to help ease discomfort: ? Deep breathing and  muscle relaxation. ? Taking a walk. ? Taking a warm bath or shower.  Drink some fluids and have a light snack if you feel like it.  Keep track of your contractions.  Based on the plan you created with your birth care provider, call when your contractions indicate it is time.  If your water breaks, note the time, color, and odor of the fluid.  When you are in the active phase, do your breathing exercises and rely on your support people and your team of birth care providers.   Contact a health care provider if:  Your contractions are strong and regular.  You have lower back pain or cramping.  Your water breaks.  You lose your mucus plug. Get help right away if you:  Have a severe headache that does not go away.  Have changes in your vision.  Have severe pain in your upper belly.  Do not feel the baby move.  Have bright red bleeding. Summary  The first stage of labor starts when true labor begins, and it ends when your cervix is dilated to 10 cm.  The first stage of labor has three phases: early, active, and transitional.  Your baby moves into the birth canal during the first stage of labor.  You may have contractions that become stronger and longer. You may also lose your mucus plug and have your water break.  Call your birth care provider when your contractions are frequent and strong enough to go to the hospital or birth center. This information is not intended to replace advice given to you by your health care provider. Make sure you discuss any questions you have with your health care provider. Document Revised: 06/28/2018 Document Reviewed: 05/21/2017 Elsevier Patient Education  2021 Elsevier Inc.  

## 2020-04-21 ENCOUNTER — Encounter (HOSPITAL_COMMUNITY): Payer: Self-pay | Admitting: Obstetrics and Gynecology

## 2020-04-21 ENCOUNTER — Inpatient Hospital Stay (HOSPITAL_COMMUNITY): Payer: Medicaid Other | Admitting: Anesthesiology

## 2020-04-21 DIAGNOSIS — Z3A40 40 weeks gestation of pregnancy: Secondary | ICD-10-CM

## 2020-04-21 DIAGNOSIS — O480 Post-term pregnancy: Secondary | ICD-10-CM

## 2020-04-21 DIAGNOSIS — O26893 Other specified pregnancy related conditions, third trimester: Secondary | ICD-10-CM | POA: Diagnosis present

## 2020-04-21 DIAGNOSIS — O139 Gestational [pregnancy-induced] hypertension without significant proteinuria, unspecified trimester: Secondary | ICD-10-CM | POA: Diagnosis present

## 2020-04-21 DIAGNOSIS — Z20822 Contact with and (suspected) exposure to covid-19: Secondary | ICD-10-CM | POA: Diagnosis present

## 2020-04-21 DIAGNOSIS — O411290 Chorioamnionitis, unspecified trimester, not applicable or unspecified: Secondary | ICD-10-CM | POA: Diagnosis not present

## 2020-04-21 DIAGNOSIS — O134 Gestational [pregnancy-induced] hypertension without significant proteinuria, complicating childbirth: Secondary | ICD-10-CM | POA: Diagnosis present

## 2020-04-21 DIAGNOSIS — O411230 Chorioamnionitis, third trimester, not applicable or unspecified: Secondary | ICD-10-CM

## 2020-04-21 DIAGNOSIS — O9902 Anemia complicating childbirth: Secondary | ICD-10-CM | POA: Diagnosis present

## 2020-04-21 DIAGNOSIS — Z23 Encounter for immunization: Secondary | ICD-10-CM | POA: Diagnosis not present

## 2020-04-21 DIAGNOSIS — D649 Anemia, unspecified: Secondary | ICD-10-CM | POA: Diagnosis present

## 2020-04-21 LAB — SARS CORONAVIRUS 2 BY RT PCR (HOSPITAL ORDER, PERFORMED IN ~~LOC~~ HOSPITAL LAB): SARS Coronavirus 2: NEGATIVE

## 2020-04-21 LAB — CBC
HCT: 34.9 % — ABNORMAL LOW (ref 36.0–46.0)
HCT: 34 % — ABNORMAL LOW (ref 36.0–46.0)
Hemoglobin: 11.4 g/dL — ABNORMAL LOW (ref 12.0–15.0)
Hemoglobin: 11.4 g/dL — ABNORMAL LOW (ref 12.0–15.0)
MCH: 28.1 pg (ref 26.0–34.0)
MCH: 28.4 pg (ref 26.0–34.0)
MCHC: 32.7 g/dL (ref 30.0–36.0)
MCHC: 33.5 g/dL (ref 30.0–36.0)
MCV: 86.2 fL (ref 80.0–100.0)
MCV: 84.8 fL (ref 80.0–100.0)
Platelets: 202 10*3/uL (ref 150–400)
Platelets: 173 10*3/uL (ref 150–400)
RBC: 4.05 MIL/uL (ref 3.87–5.11)
RBC: 4.01 MIL/uL (ref 3.87–5.11)
RDW: 18.7 % — ABNORMAL HIGH (ref 11.5–15.5)
RDW: 18.4 % — ABNORMAL HIGH (ref 11.5–15.5)
WBC: 6 10*3/uL (ref 4.0–10.5)
WBC: 10.8 10*3/uL — ABNORMAL HIGH (ref 4.0–10.5)
nRBC: 0 % (ref 0.0–0.2)
nRBC: 0 % (ref 0.0–0.2)

## 2020-04-21 LAB — COMPREHENSIVE METABOLIC PANEL
ALT: 10 U/L (ref 0–44)
AST: 16 U/L (ref 15–41)
Albumin: 3.2 g/dL — ABNORMAL LOW (ref 3.5–5.0)
Alkaline Phosphatase: 121 U/L (ref 38–126)
Anion gap: 12 (ref 5–15)
BUN: 7 mg/dL (ref 6–20)
CO2: 20 mmol/L — ABNORMAL LOW (ref 22–32)
Calcium: 9.6 mg/dL (ref 8.9–10.3)
Chloride: 105 mmol/L (ref 98–111)
Creatinine, Ser: 0.68 mg/dL (ref 0.44–1.00)
GFR, Estimated: >60 mL/min (ref >60)
Glucose, Bld: 84 mg/dL (ref 70–99)
Potassium: 3.7 mmol/L (ref 3.5–5.1)
Sodium: 137 mmol/L (ref 135–145)
Total Bilirubin: 0.5 mg/dL (ref 0.3–1.2)
Total Protein: 6.9 g/dL (ref 6.5–8.1)

## 2020-04-21 LAB — TYPE AND SCREEN
ABO/RH(D): A POS
Antibody Screen: NEGATIVE

## 2020-04-21 LAB — RPR: RPR Ser Ql: NONREACTIVE

## 2020-04-21 LAB — PROTEIN / CREATININE RATIO, URINE
Creatinine, Urine: 24.68 mg/dL
Total Protein, Urine: <6 mg/dL

## 2020-04-21 MED ORDER — TETANUS-DIPHTH-ACELL PERTUSSIS 5-2.5-18.5 LF-MCG/0.5 IM SUSY: 0.5000 mL | PREFILLED_SYRINGE | Freq: Once | INTRAMUSCULAR | Status: DC

## 2020-04-21 MED ORDER — IBUPROFEN 600 MG PO TABS
600.0000 mg | ORAL_TABLET | Freq: Four times a day (QID) | ORAL | Status: DC
  Filled 2020-04-21: qty 1

## 2020-04-21 MED ORDER — ACETAMINOPHEN 160 MG/5ML PO SOLN
650.0000 mg | ORAL | Status: DC | PRN
  Filled 2020-04-21: qty 20.3

## 2020-04-21 MED ORDER — EPHEDRINE 5 MG/ML INJ: 10.0000 mg | INTRAVENOUS | Status: DC | PRN

## 2020-04-21 MED ORDER — PHENYLEPHRINE 40 MCG/ML (10ML) SYRINGE FOR IV PUSH (FOR BLOOD PRESSURE SUPPORT): 80.0000 ug | PREFILLED_SYRINGE | INTRAVENOUS | Status: DC | PRN

## 2020-04-21 MED ORDER — SOD CITRATE-CITRIC ACID 500-334 MG/5ML PO SOLN: 30.0000 mL | ORAL | Status: DC | PRN

## 2020-04-21 MED ORDER — LABETALOL HCL 5 MG/ML IV SOLN
INTRAVENOUS | Status: AC
  Filled 2020-04-21: qty 4

## 2020-04-21 MED ORDER — ACETAMINOPHEN 325 MG PO TABS
650.0000 mg | ORAL_TABLET | ORAL | Status: DC | PRN
  Filled 2020-04-21: qty 2

## 2020-04-21 MED ORDER — AMLODIPINE BESYLATE 5 MG PO TABS
5.0000 mg | ORAL_TABLET | Freq: Every day | ORAL | Status: DC
  Administered 2020-04-22 – 2020-04-23 (×2): 5 mg via ORAL
  Filled 2020-04-21 (×2): qty 1

## 2020-04-21 MED ORDER — FENTANYL-BUPIVACAINE-NACL 0.5-0.125-0.9 MG/250ML-% EP SOLN
12.0000 mL/h | EPIDURAL | Status: DC | PRN
  Filled 2020-04-21: qty 250

## 2020-04-21 MED ORDER — OXYCODONE-ACETAMINOPHEN 5-325 MG PO TABS: 1.0000 | ORAL_TABLET | ORAL | Status: DC | PRN

## 2020-04-21 MED ORDER — TRANEXAMIC ACID-NACL 1000-0.7 MG/100ML-% IV SOLN
1000.0000 mg | Freq: Once | INTRAVENOUS | Status: AC
  Administered 2020-04-21: 1000 mg via INTRAVENOUS

## 2020-04-21 MED ORDER — OXYTOCIN-SODIUM CHLORIDE 30-0.9 UT/500ML-% IV SOLN
2.5000 [IU]/h | INTRAVENOUS | Status: DC
  Administered 2020-04-21: 2.5 [IU]/h via INTRAVENOUS
  Filled 2020-04-21: qty 500

## 2020-04-21 MED ORDER — DOCUSATE SODIUM 100 MG PO CAPS
100.0000 mg | ORAL_CAPSULE | Freq: Two times a day (BID) | ORAL | Status: DC
  Filled 2020-04-21: qty 1

## 2020-04-21 MED ORDER — TRANEXAMIC ACID-NACL 1000-0.7 MG/100ML-% IV SOLN
INTRAVENOUS | Status: AC
  Filled 2020-04-21: qty 100

## 2020-04-21 MED ORDER — LACTATED RINGERS IV SOLN: 500.0000 mL | Freq: Once | INTRAVENOUS | Status: DC

## 2020-04-21 MED ORDER — BENZOCAINE-MENTHOL 20-0.5 % EX AERO
1.0000 "application " | INHALATION_SPRAY | CUTANEOUS | Status: DC | PRN
  Administered 2020-04-22: 1 via TOPICAL
  Filled 2020-04-21: qty 56

## 2020-04-21 MED ORDER — FENTANYL CITRATE (PF) 100 MCG/2ML IJ SOLN
INTRAMUSCULAR | Status: DC | PRN
  Administered 2020-04-21: 100 ug via EPIDURAL

## 2020-04-21 MED ORDER — LACTATED RINGERS IV SOLN: 500.0000 mL | INTRAVENOUS | Status: DC | PRN

## 2020-04-21 MED ORDER — HYDRALAZINE HCL 20 MG/ML IJ SOLN: 10.0000 mg | INTRAMUSCULAR | Status: DC | PRN

## 2020-04-21 MED ORDER — PHENYLEPHRINE 40 MCG/ML (10ML) SYRINGE FOR IV PUSH (FOR BLOOD PRESSURE SUPPORT)
80.0000 ug | PREFILLED_SYRINGE | INTRAVENOUS | Status: DC | PRN
  Filled 2020-04-21: qty 10

## 2020-04-21 MED ORDER — FENTANYL CITRATE (PF) 100 MCG/2ML IJ SOLN
INTRAMUSCULAR | Status: DC | PRN
  Administered 2020-04-21: 15 ug via INTRATHECAL

## 2020-04-21 MED ORDER — LIDOCAINE HCL (PF) 1 % IJ SOLN: 30.0000 mL | INTRAMUSCULAR | Status: DC | PRN

## 2020-04-21 MED ORDER — GENTAMICIN SULFATE 40 MG/ML IJ SOLN
5.0000 mg/kg | Freq: Once | INTRAVENOUS | Status: AC
  Administered 2020-04-21: 410 mg via INTRAVENOUS
  Filled 2020-04-21: qty 10.25

## 2020-04-21 MED ORDER — LABETALOL HCL 5 MG/ML IV SOLN: 40.0000 mg | INTRAVENOUS | Status: DC | PRN

## 2020-04-21 MED ORDER — OXYCODONE-ACETAMINOPHEN 5-325 MG PO TABS: 2.0000 | ORAL_TABLET | ORAL | Status: DC | PRN

## 2020-04-21 MED ORDER — DIPHENHYDRAMINE HCL 25 MG PO CAPS: 25.0000 mg | ORAL_CAPSULE | Freq: Four times a day (QID) | ORAL | Status: DC | PRN

## 2020-04-21 MED ORDER — BUPIVACAINE HCL (PF) 0.25 % IJ SOLN
INTRAMUSCULAR | Status: DC | PRN
  Administered 2020-04-21: 8 mL via EPIDURAL

## 2020-04-21 MED ORDER — FENTANYL CITRATE (PF) 100 MCG/2ML IJ SOLN
INTRAMUSCULAR | Status: AC
  Administered 2020-04-21: 100 ug via INTRAVENOUS
  Filled 2020-04-21: qty 4

## 2020-04-21 MED ORDER — FENTANYL CITRATE (PF) 100 MCG/2ML IJ SOLN
50.0000 ug | INTRAMUSCULAR | Status: DC | PRN
  Filled 2020-04-21 (×2): qty 2

## 2020-04-21 MED ORDER — PRENATAL MULTIVITAMIN CH: 1.0000 | ORAL_TABLET | Freq: Every day | ORAL | Status: DC

## 2020-04-21 MED ORDER — SENNOSIDES-DOCUSATE SODIUM 8.6-50 MG PO TABS: 2.0000 | ORAL_TABLET | ORAL | Status: DC

## 2020-04-21 MED ORDER — BUPIVACAINE HCL (PF) 0.25 % IJ SOLN
INTRAMUSCULAR | Status: DC | PRN
  Administered 2020-04-21: 1 mL via INTRATHECAL

## 2020-04-21 MED ORDER — LIDOCAINE HCL (PF) 1 % IJ SOLN
INTRAMUSCULAR | Status: DC | PRN
  Administered 2020-04-21: 2 mL via EPIDURAL
  Administered 2020-04-21: 10 mL via EPIDURAL

## 2020-04-21 MED ORDER — DIPHENHYDRAMINE HCL 50 MG/ML IJ SOLN: 12.5000 mg | INTRAMUSCULAR | Status: DC | PRN

## 2020-04-21 MED ORDER — LABETALOL HCL 5 MG/ML IV SOLN: 20.0000 mg | INTRAVENOUS | Status: DC | PRN

## 2020-04-21 MED ORDER — IBUPROFEN 100 MG/5ML PO SUSP
600.0000 mg | Freq: Four times a day (QID) | ORAL | Status: DC
  Administered 2020-04-22 – 2020-04-23 (×5): 600 mg via ORAL
  Filled 2020-04-21 (×5): qty 30

## 2020-04-21 MED ORDER — FENTANYL CITRATE (PF) 100 MCG/2ML IJ SOLN: 100.0000 ug | Freq: Once | INTRAMUSCULAR | Status: AC

## 2020-04-21 MED ORDER — ONDANSETRON HCL 4 MG PO TABS: 4.0000 mg | ORAL_TABLET | ORAL | Status: DC | PRN

## 2020-04-21 MED ORDER — OXYTOCIN BOLUS FROM INFUSION
333.0000 mL | Freq: Once | INTRAVENOUS | Status: AC
  Administered 2020-04-21: 333 mL via INTRAVENOUS

## 2020-04-21 MED ORDER — SIMETHICONE 80 MG PO CHEW: 80.0000 mg | CHEWABLE_TABLET | ORAL | Status: DC | PRN

## 2020-04-21 MED ORDER — COCONUT OIL OIL: 1.0000 "application " | TOPICAL_OIL | Status: DC | PRN

## 2020-04-21 MED ORDER — FENTANYL-BUPIVACAINE-NACL 0.5-0.125-0.9 MG/250ML-% EP SOLN
EPIDURAL | Status: DC | PRN
  Administered 2020-04-21: 12 mL/h via EPIDURAL

## 2020-04-21 MED ORDER — LACTATED RINGERS IV SOLN: INTRAVENOUS | Status: DC

## 2020-04-21 MED ORDER — LIDOCAINE-EPINEPHRINE (PF) 2 %-1:200000 IJ SOLN
INTRAMUSCULAR | Status: DC | PRN
  Administered 2020-04-21: 5 mL via EPIDURAL

## 2020-04-21 MED ORDER — ACETAMINOPHEN 325 MG PO TABS: 650.0000 mg | ORAL_TABLET | ORAL | Status: DC | PRN

## 2020-04-21 MED ORDER — DIBUCAINE (PERIANAL) 1 % EX OINT: 1.0000 "application " | TOPICAL_OINTMENT | CUTANEOUS | Status: DC | PRN

## 2020-04-21 MED ORDER — OXYTOCIN-SODIUM CHLORIDE 30-0.9 UT/500ML-% IV SOLN
1.0000 m[IU]/min | INTRAVENOUS | Status: DC
  Administered 2020-04-21: 2 m[IU]/min via INTRAVENOUS

## 2020-04-21 MED ORDER — LABETALOL HCL 5 MG/ML IV SOLN
80.0000 mg | INTRAVENOUS | Status: DC | PRN
Start: 2020-04-21 — End: 2020-04-21

## 2020-04-21 MED ORDER — DOCUSATE SODIUM 50 MG/5ML PO LIQD
100.0000 mg | Freq: Two times a day (BID) | ORAL | Status: DC
  Administered 2020-04-22 – 2020-04-23 (×3): 100 mg via ORAL
  Filled 2020-04-21 (×5): qty 10

## 2020-04-21 MED ORDER — TERBUTALINE SULFATE 1 MG/ML IJ SOLN: 0.2500 mg | Freq: Once | INTRAMUSCULAR | Status: DC | PRN

## 2020-04-21 MED ORDER — FENTANYL CITRATE (PF) 100 MCG/2ML IJ SOLN
15.0000 ug | Freq: Once | INTRAMUSCULAR | Status: AC
  Administered 2020-04-21: 15 ug via EPIDURAL

## 2020-04-21 MED ORDER — WITCH HAZEL-GLYCERIN EX PADS: 1.0000 "application " | MEDICATED_PAD | CUTANEOUS | Status: DC | PRN

## 2020-04-21 MED ORDER — ACETAMINOPHEN 500 MG PO TABS
1000.0000 mg | ORAL_TABLET | ORAL | Status: DC
  Filled 2020-04-21: qty 2

## 2020-04-21 MED ORDER — ONDANSETRON HCL 4 MG/2ML IJ SOLN: 4.0000 mg | INTRAMUSCULAR | Status: DC | PRN

## 2020-04-21 MED ORDER — SODIUM CHLORIDE 0.9 % IV SOLN
2.0000 g | Freq: Four times a day (QID) | INTRAVENOUS | Status: DC
  Administered 2020-04-21: 2 g via INTRAVENOUS
  Filled 2020-04-21: qty 2000

## 2020-04-21 MED ORDER — ONDANSETRON HCL 4 MG/2ML IJ SOLN: 4.0000 mg | Freq: Four times a day (QID) | INTRAMUSCULAR | Status: DC | PRN

## 2020-04-21 NOTE — MAU Note (Signed)
G1P0 at 40.2 weeks with complaints of contractions throughout the day, at 2000 contractions increased in intensity. Pt denies SROM or vaginal bleeding but does report bloody show. Endorses + fetal movement. Pt denies complications or problems with the pregnancy.

## 2020-04-21 NOTE — MAU Provider Note (Signed)
None      S: Ms. Ashley Graham is a 20 y.o. G1P0 at [redacted]w[redacted]d  who presents to MAU today complaining contractions q 2-3 minutes since 2000 (were happening since this morning but got stronger and more consistent at 2000). She denies vaginal bleeding. She denies LOF. She reports normal fetal movement.    O: LMP 07/14/2019   SpO2 100%  GENERAL: Well-developed, well-nourished female in no acute distress.  HEAD: Normocephalic, atraumatic.  CHEST: Normal effort of breathing, regular heart rate ABDOMEN: Soft, nontender, gravid  Cervical exam x2:  Dilation: 1 Effacement (%): 70 Cervical Position: Posterior Station: -2 Presentation: Vertex Exam by:: Edd Arbour, CNM  Fetal Monitoring prior to first cervical exam: reactive Baseline: 135 Variability: moderate Accelerations: present (15x15) Decelerations: none Contractions: q2-10min  Fetal monitoring just prior to and after recheck: Baseline: 125 Variability: minimal Accelerations: one prolonged without variability Decelerations: occasional variable Toco: UI, pt rates as still q5-60min but not as strong  Pt turned on side and given ginger ale, baby remained minimally reactive. Recommend admission to L&D for augmentation of labor r/t NRNST  Report called to Mart Piggs, MD.  AAloha Gell at [redacted]w[redacted]d  Latent labor with NRNST  P: Admit to L&D for augmentation of labor   Bernerd Limbo, CNM 04/21/2020 4:30 AM

## 2020-04-21 NOTE — Discharge Summary (Signed)
Postpartum Discharge Summary  Date of Service updated 04/23/20     Patient Name: Ashley Graham DOB: [birth year] MRN: [id number]  Date of admission: 04/20/2020 Delivery date:04/21/2020  Delivering provider: Janet Berlin  Date of discharge: 04/23/2020  Admitting diagnosis: Indication for care in labor or delivery [O75.9] Intrauterine pregnancy: [redacted]w[redacted]d    Secondary diagnosis:  Active Problems:   Supervision of normal first teen pregnancy   Anemia in pregnancy   Uterine size date discrepancy   Indication for care in labor or delivery   Gestational hypertension   Chorioamnionitis   Vaginal delivery  Additional problems: none    Discharge diagnosis: Term Pregnancy Delivered and Gestational Hypertension                                              Post partum procedures:none Augmentation: N/A Complications: None  Hospital course: Onset of Labor With Vaginal Delivery      20y.o. yo G1P0 at 453w2das admitted in Latent Labor on 04/20/2020. Patient had an uncomplicated labor course as follows:  Membrane Rupture Time/Date: 10:40 AM ,04/21/2020   Delivery Method:Vaginal, Spontaneous  Episiotomy: None  Lacerations:  2nd degree;Labial  Patient had an uncomplicated postpartum course.  She is ambulating, tolerating a regular diet, passing flatus, and urinating well. Patient is discharged home in stable condition on 04/23/20. She did have a temp immediately postpartum and was given amp/gent with resolution of symptoms.  Newborn Data: Birth date:04/21/2020  Birth time:6:45 PM  Gender:Female  Living status:Living  Apgars:6 ,9  Weight:3059 g   Magnesium Sulfate received: No BMZ received: No Rhophylac:N/A MMR:N/A T-DaP:Given prenatally Flu: No Transfusion:No but received IV feraheme prenatally   Physical exam  Vitals:   04/22/20 1013 04/22/20 1333 04/22/20 2044 04/23/20 0420  BP: 121/68 123/78 108/68 121/76  Pulse: 85 81 95 84  Resp: 17 18 16 15   Temp: 97.8 F (36.6 C) 98.3 F  (36.8 C) 97.6 F (36.4 C) 97.7 F (36.5 C)  TempSrc: Oral Axillary Oral Oral  SpO2: 100%  100% 100%  Weight:      Height:       General: alert, cooperative and no distress Lochia: appropriate Uterine Fundus: firm Incision: N/A DVT Evaluation: No evidence of DVT seen on physical exam. Labs: Lab Results  Component Value Date   WBC 10.8 (H) 04/21/2020   HGB 11.4 (L) 04/21/2020   HCT 34.0 (L) 04/21/2020   MCV 84.8 04/21/2020   PLT 173 04/21/2020   CMP Latest Ref Rng & Units 04/21/2020  Glucose 70 - 99 mg/dL 84  BUN 6 - 20 mg/dL 7  Creatinine 0.44 - 1.00 mg/dL 0.68  Sodium 135 - 145 mmol/L 137  Potassium 3.5 - 5.1 mmol/L 3.7  Chloride 98 - 111 mmol/L 105  CO2 22 - 32 mmol/L 20(L)  Calcium 8.9 - 10.3 mg/dL 9.6  Total Protein 6.5 - 8.1 g/dL 6.9  Total Bilirubin 0.3 - 1.2 mg/dL 0.5  Alkaline Phos 38 - 126 U/L 121  AST 15 - 41 U/L 16  ALT 0 - 44 U/L 10   Edinburgh Score: Edinburgh Postnatal Depression Scale Screening Tool 04/22/2020  I have been able to laugh and see the funny side of things. (No Data)     After visit meds:  Allergies as of 04/23/2020   No Known Allergies  Medication List    STOP taking these medications   docusate calcium 240 MG capsule Commonly known as: SURFAK   Iron 325 (65 Fe) MG Tabs   Vitafol Gummies 3.33-0.333-34.8 MG Chew     TAKE these medications   acetaminophen 160 MG/5ML solution Commonly known as: TYLENOL Take 31.3 mLs (1,000 mg total) by mouth every 6 (six) hours as needed for mild pain, moderate pain or fever (pain scale < 4).   amLODipine 5 MG tablet Commonly known as: NORVASC Take 1 tablet (5 mg total) by mouth daily.   Blood Pressure Kit Devi 1 kit by Does not apply route once a week. Check Blood Pressure regularly and record readings into the Babyscripts App.  Large Cuff.  DX O90.0   ibuprofen 100 MG/5ML suspension Commonly known as: ADVIL Take 30 mLs (600 mg total) by mouth every 6 (six) hours.        Discharge  home in stable condition Infant Feeding: Bottle Infant Disposition:home with mother Discharge instruction: per After Visit Summary and Postpartum booklet. Activity: Advance as tolerated. Pelvic rest for 6 weeks.  Diet: routine diet Future Appointments: Future Appointments  Date Time Provider [place]  04/28/2020 11:00 AM [place] None  05/28/2020 11:15 AM Lajean Manes, CNM [facility] None   Follow up Visit:   Please schedule this patient for a In person postpartum visit in 6 weeks with the following provider: Any provider. Additional Postpartum F/U:BP check 1 week  High risk pregnancy complicated by: gHTN, Triple I, teenage pregnancy  Delivery mode:  Vaginal, Spontaneous  Anticipated Birth Control:  Unsure   05/21/2023 Arrie Senate, MD

## 2020-04-21 NOTE — Lactation Note (Signed)
This note was copied from a baby's chart. Lactation Consultation Note  Patient Name: Ashley Graham KGMWN'U Date: 04/21/2020 Reason for consult: L&D Initial assessment;Term;Primapara;1st time breastfeeding;Other (Comment) (teen pregnancy) Age:20 hours  Visited with mom of < 1 hour old FT female, she's a P1 and reported (+) breast changes during the pregnancy. Mom was alone in the room with baby when [facility] arrived, her support person had left for the day already. Baby was swaddled not doing STS; LC asked mom if she would like to try to latch baby on and do STS.  Mom voiced that she will try because she doesn't have any formula to feed him now. Explained to mom about the small size of baby's stomach and how her colostrum will be adequate at this point. When [facility] showed mom how to hand express, colostrum was easily coming off her left breast, praised her for her efforts.  Her feeding choice on admission was to do both, breast and formula, but she said she'll try BF while at the hospital. [facility] took baby STS to mother's left breast in cross cradle position and he was able to latch briefly just a few sucks before letting go. He was in his quiet alert stated but would not sustain the latch.   Mom asked to take baby off the breast, LC offered to do some STS, and let her RN know to put baby back to breast in case mom wants to try again later. Mom and baby doing STS when exited the room. Reviewed normal newborn behavior, cluster feeding and feeding cues. Encouraged to feed STS 8-12 times/24 hours.    Maternal Data Has patient been taught Hand Expression?: Yes Does the patient have breastfeeding experience prior to this delivery?: No  Feeding Mother's Current Feeding Choice: Breast Milk  LATCH Score Latch: Repeated attempts needed to sustain latch, nipple held in mouth throughout feeding, stimulation needed to elicit sucking reflex.  Audible Swallowing: None  Type of Nipple: Everted at rest and after  stimulation  Comfort (Breast/Nipple): Soft / non-tender  Hold (Positioning): Assistance needed to correctly position infant at breast and maintain latch.  LATCH Score: 6   Lactation Tools Discussed/Used    Interventions Interventions: Breast feeding basics reviewed;Assisted with latch;Skin to skin;Breast massage;Hand express;Breast compression;Adjust position  Discharge [facility] Program: Yes  Consult Status Consult Status: Follow-up Date: 04/22/20 Follow-up type: In-patient    Willet Schleifer Venetia Constable 04/21/2020, 7:58 PM

## 2020-04-21 NOTE — Progress Notes (Signed)
Labor Progress Note Ashley Graham is a 20 y.o. G1P0 at [redacted]w[redacted]d presented for early labor  S: struggling with pain control   O:  BP 126/74   Pulse (!) 103   Temp 98.9 F (37.2 C) (Oral)   Resp 18   Ht 5\' 4"  (1.626 m)   Wt 81.6 kg   LMP 07/14/2019   SpO2 99%   BMI 30.90 kg/m  EFM: baseilne 120/mod to min variability/pos accels/no decels   CVE: Dilation: 6 Effacement (%): 80 Cervical Position: Middle Station: -2 Presentation: Vertex Exam by:: stone rnc   A&P: 20 y.o. G1P0 [redacted]w[redacted]d here in spontaneous labor.   #Labor: Progressing well. S/p SROM at 1040, however contractions have now spaced out. Will start pitocin at this time.  #Pain: epidural in place, anesthesia has evaluated. Continue trial of bolus, may need replacement if not improved.  #FWB: cat I  #GBS negative    [redacted]w[redacted]d, MD 1:35 PM

## 2020-04-21 NOTE — H&P (Signed)
OBSTETRIC ADMISSION HISTORY AND PHYSICAL  Ashley Graham is a 20 y.o. female G1P0 with IUP at 77w2dby LMP presenting for contractions. In the MAU her FHT had minimal to moderate variability and given GA admission requested at that time. She reports +FMs, No LOF, no VB, no blurry vision, headaches or peripheral edema, and RUQ pain.  She plans on breast feeding. She declines birth control. [place] [facility] [place]MP --->  Estimated Date of Delivery: 04/19/20  Sono:    12/03/19_0 , CWD, normal anatomy, breech presentation, posterior placental lie, 359g, 59% EFW   Prenatal History/Complications:  Teen pregnancy Anemia of pregnancy (outpatient feraheme)   Past Medical History: Past Medical History:  Diagnosis Date  . Heart murmur   . History of PCR DNA positive for HSV1     Past Surgical History: Past Surgical History:  Procedure Laterality Date  . NO PAST SURGERIES      Obstetrical History: OB History    Gravida  1   Para      Term      Preterm      AB      Living        SAB      IAB      Ectopic      Multiple      Live Births              Social History Social History   Socioeconomic History  . Marital status: Single    Spouse name: Not on file  . Number of children: Not on file  . Years of education: Not on file  . Highest education level: Not on file  Occupational History  . Not on file  Tobacco Use  . Smoking status: Never Smoker  . Smokeless tobacco: Never Used  Vaping Use  . Vaping Use: Never used  Substance and Sexual Activity  . Alcohol use: Never  . Drug use: Yes    Types: Marijuana    Comment: last use 11/14/19  . Sexual activity: Yes    Comment: pregnant  Other Topics Concern  . Not on file  Social History Narrative  . Not on file   Social Determinants of Health   Financial Resource Strain: Not on file  Food Insecurity: Not on file  Transportation Needs: Not on file  Physical Activity:  Not on file  Stress: Not on file  Social Connections: Not on file    Family History: Family History  Problem Relation Age of Onset  . Healthy Mother   . Diabetes Maternal Uncle   . Diabetes Other   . Hypertension Other     Allergies: No Known Allergies  Medications Prior to Admission  Medication Sig Dispense Refill Last Dose  . Blood Pressure Monitoring (BLOOD PRESSURE KIT) DEVI 1 kit by Does not apply route once a week. Check Blood Pressure regularly and record readings into the Babyscripts App.  Large Cuff.  DX O90.0 1 each 0   . docusate calcium (SURFAK) 240 MG capsule Take 1 capsule (240 mg total) by mouth daily. (Patient not taking: No sig reported) 30 capsule 2   . Ferrous Sulfate (IRON) 325 (65 Fe) MG TABS Take 1 tablet (325 mg total) by mouth 2 (two) times daily with a meal. Take this every other day. (Patient not taking: No sig reported) 60 tablet 1   . Prenatal Vit-Fe Phos-FA-Omega (VITAFOL GUMMIES) 3.33-0.333-34.8 MG CHEW Chew 3 tablets by mouth  daily before breakfast. (Patient not taking: No sig reported) 90 tablet 11      Review of Systems   All systems reviewed and negative except as stated in HPI  Blood pressure 119/71, pulse 88, temperature 98.9 F (37.2 C), temperature source Axillary, resp. rate 18, height _0  (1.626 m), weight 81.6 kg, last menstrual period 07/14/2019, SpO2 100 %. General appearance: alert, cooperative and no distress Lungs: normal respiratory effort Heart: regular rate and rhythm Abdomen: soft, non-tender; gravid Pelvic: as noted below Extremities: Homans sign is negative, no sign of DVT Presentation: cephalic by cervical exam Fetal monitoringBaseline: 120 bpm, Variability: Good {> 6 bpm), Accelerations: Reactive and Decelerations: Absent Uterine activityFrequency: Every 1-5 minutes Dilation: 4 Effacement (%): 80 Station: -3 Exam by:: Corliss Blacker, MD   Prenatal labs: ABO, Rh: --/--/A POS (02/01 7124) Antibody: NEG (02/01  5809) Rubella: 2.43 (09/02 1355) RPR: Non Reactive (11/11 1106)  HBsAg: Negative (09/02 1355)  HIV: Non Reactive (11/11 1106)  GBS: Negative/-- (01/05 1143)  2 hr Glucola passed Genetic screening  normal Anatomy US normal  Prenatal Transfer Tool  Maternal Diabetes: No Genetic Screening: Normal Maternal Ultrasounds/Referrals: Normal Fetal Ultrasounds or other Referrals:  None Maternal Substance Abuse:  No Significant Maternal Medications:  None Significant Maternal Lab Results: Group B Strep negative  Results for orders placed or performed during the hospital encounter of 04/20/20 (from the past 24 hour(s))  SARS Coronavirus 2 by RT PCR (hospital order, performed in [place] hospital lab) Nasopharyngeal Nasopharyngeal Swab   Collection Time: 04/21/20  5:13 AM   Specimen: Nasopharyngeal Swab  Result Value Ref Range   SARS Coronavirus 2 NEGATIVE NEGATIVE  CBC   Collection Time: 04/21/20  5:13 AM  Result Value Ref Range   WBC 6.0 4.0 - 10.5 K/uL   RBC 4.05 3.87 - 5.11 MIL/uL   Hemoglobin 11.4 (L) 12.0 - 15.0 g/dL   HCT 34.9 (L) 36.0 - 46.0 %   MCV 86.2 80.0 - 100.0 fL   MCH 28.1 26.0 - 34.0 pg   MCHC 32.7 30.0 - 36.0 g/dL   RDW 18.7 (H) 11.5 - 15.5 %   Platelets 202 150 - 400 K/uL   nRBC 0.0 0.0 - 0.2 %  Type and screen [place]   Collection Time: 04/21/20  5:13 AM  Result Value Ref Range   ABO/RH(D) A POS    Antibody Screen NEG    Sample Expiration      04/24/2020,2359 Performed at [facility], [place] [address], [place], [place] [address]     Patient Active Problem List   Diagnosis Date Noted  . Indication for care in labor or delivery 04/21/2020  . Excess weight gain in pregnancy 04/15/2020  . Uterine size date discrepancy 03/23/2020  . Anemia in pregnancy 02/02/2020  . Supervision of normal first teen pregnancy 10/02/2019    Assessment/Plan:  Ashley Graham is a 20 y.o. G1P0 at 9w2dhere for NRFHT/latent labor.  #Labor: Patient  presented to MAU for contractions and was found to have minimal to moderate variability on her FHT, given her GA decision was made to admit her for IOL. Patient is contracting every 1-5 min and made cervical change from 1-->3cm on admission. Continue expectant management, consider AROM with next check if patient unchanged vs start pitocin. #Pain: PRN #FWB:  cat 1 #ID: GBS neg #MOF: breast #MOC: declines #Circ: yes, unsure if she would like inpatient #Teen pregnancy: SW postpartum  AArrie Senate  MD  04/21/2020, 7:11 AM

## 2020-04-21 NOTE — Anesthesia Procedure Notes (Addendum)
Epidural Patient location during procedure: OB Start time: 04/21/2020 7:37 AM End time: 04/21/2020 7:53 AM  Staffing Anesthesiologist: Lannie Fields, DO Performed: anesthesiologist   Preanesthetic Checklist Completed: patient identified, IV checked, risks and benefits discussed, monitors and equipment checked, pre-op evaluation and timeout performed  Epidural Patient position: sitting Prep: DuraPrep and site prepped and draped Patient monitoring: continuous pulse ox, blood pressure, heart rate and cardiac monitor Approach: midline Location: L3-L4 Injection technique: LOR air  Needle:  Needle type: Tuohy  Needle gauge: 17 G Needle length: 9 cm Needle insertion depth: 6 cm Catheter type: closed end flexible Catheter size: 19 Gauge Catheter at skin depth: 11 cm Test dose: negative  Assessment Sensory level: T8 Events: blood not aspirated, injection not painful, no injection resistance, no paresthesia and negative IV test  Additional Notes Difficult placement at one level, poor patient cooperation for positioning Reason for block:procedure for pain

## 2020-04-21 NOTE — Anesthesia Preprocedure Evaluation (Signed)
Anesthesia Evaluation  Patient identified by MRN, date of birth, ID band Patient awake    Reviewed: Allergy & Precautions, Patient's Chart, lab work & pertinent test results  Airway Mallampati: II  TM Distance: >3 FB Neck ROM: Full    Dental no notable dental hx.    Pulmonary neg pulmonary ROS,    Pulmonary exam normal breath sounds clear to auscultation       Cardiovascular negative cardio ROS Normal cardiovascular exam Rhythm:Regular Rate:Normal     Neuro/Psych negative neurological ROS  negative psych ROS   GI/Hepatic negative GI ROS, (+)     substance abuse  marijuana use,   Endo/Other  negative endocrine ROS  Renal/GU negative Renal ROS  negative genitourinary   Musculoskeletal negative musculoskeletal ROS (+)   Abdominal   Peds negative pediatric ROS (+)  Hematology  (+) Blood dyscrasia, anemia , hct 34.9, plt 202   Anesthesia Other Findings   Reproductive/Obstetrics (+) Pregnancy                             Anesthesia Physical Anesthesia Plan  ASA: II and emergent  Anesthesia Plan: Epidural   Post-op Pain Management:    Induction:   PONV Risk Score and Plan: 2  Airway Management Planned: Natural Airway  Additional Equipment: None  Intra-op Plan:   Post-operative Plan:   Informed Consent: I have reviewed the patients History and Physical, chart, labs and discussed the procedure including the risks, benefits and alternatives for the proposed anesthesia with the patient or authorized representative who has indicated his/her understanding and acceptance.       Plan Discussed with:   Anesthesia Plan Comments:         Anesthesia Quick Evaluation

## 2020-04-22 ENCOUNTER — Encounter: Payer: Medicaid Other | Admitting: Obstetrics and Gynecology

## 2020-04-22 MED ORDER — COMPLETENATE 29-1 MG PO CHEW: 1.0000 | CHEWABLE_TABLET | Freq: Every day | ORAL | Status: DC

## 2020-04-22 MED ORDER — INFLUENZA VAC SPLIT QUAD 0.5 ML IM SUSY
0.5000 mL | PREFILLED_SYRINGE | INTRAMUSCULAR | Status: AC | PRN
  Administered 2020-04-23: 0.5 mL via INTRAMUSCULAR
  Filled 2020-04-22: qty 0.5

## 2020-04-22 MED ORDER — ACETAMINOPHEN 160 MG/5ML PO SOLN
1000.0000 mg | Freq: Four times a day (QID) | ORAL | Status: DC | PRN
  Administered 2020-04-22: 1000 mg via ORAL
  Filled 2020-04-22: qty 40.6

## 2020-04-22 MED ORDER — LACTATED RINGERS IV BOLUS
1000.0000 mL | Freq: Once | INTRAVENOUS | Status: AC
  Administered 2020-04-22: 1000 mL via INTRAVENOUS

## 2020-04-22 NOTE — Progress Notes (Signed)
Post Partum Day 1 Subjective: Patient is doing well without complaints. Ambulating without difficulty. Voiding and passing flatus. Tolerating PO. Abdominal pain improved. Vaginal bleeding decreased. Denies sick symptoms including cough, congestion, sob, cp. No fever/chills. Denies palpitations. Denies lightheadedness/dizziness.  Objective: Blood pressure (!) 146/94, pulse (!) 114, temperature 100 F (37.8 C), temperature source Oral, resp. rate 16, height 5\' 4"  (1.626 m), weight 81.6 kg, last menstrual period 07/14/2019, SpO2 99 %, unknown if currently breastfeeding.  Physical Exam:  General: alert, cooperative and no distress Lochia: appropriate Uterine Fundus: firm Incision: n/a DVT Evaluation: No evidence of DVT seen on physical exam.  Recent Labs    04/21/20 0513 04/21/20 2004  HGB 11.4* 11.4*  HCT 34.9* 34.0*    Assessment/Plan: PPD#1  -doing well meeting pp milestones  -breast and bottle feeding  -undecided if she would like circ for baby, consented, will notify team  #teen pregnancy: sw consulted  #triple I  -tmax 102.4 immediately after delivery, given amp/gent x1  -denies fever/chills, elevated temp but afebrile overnight, tmax 100.2  -no increased abdominal tenderness, lochia appropriate  -she remains tachycardic into the 110s  -will give 1L LR bolus and tylenol 1000 mg  #gHTN  -asymptomatic  -amlodipine 5 mg qd  Plan for discharge tomorrow.  LOS: 1 day   06/19/20 04/22/2020, 7:28 AM

## 2020-04-22 NOTE — Clinical Social Work Maternal (Signed)
CLINICAL SOCIAL WORK MATERNAL/CHILD NOTE  Patient Details  Name: Ashley Graham MRN: [id number] Date of Birth: 01/08/2001  Date:  09-13-20  Clinical Social Worker Initiating Note:  Darra Lis, MSW, [place] Date/Time: Initiated:  04/22/20/1100     Child's Name:  Carlyle Basques   Biological Parents:  Mother   Need for Interpreter:  None   Reason for Referral:  Other (Comment) (Teen Pregnancy. Noted THC use)   Address:  [address] [place] [address]    Phone number:  [phone] (home)     Additional phone number:   Household Members/Support Persons (HM/SP):   Household Member/Support Person 1,Household Member/Support Person 2,Household Member/Support Person 3,Household Member/Support Person 4   HM/SP Name Relationship DOB or Age  HM/SP -1 Victorino Dike Mother 01/09/1985  HM/SP -2 Babette Relic Stepfather 88  HM/SP -3 Ellen Henri. Brother 5  HM/SP -4 Malia Wall Sister 2 months  HM/SP -5        HM/SP -6        HM/SP -7        HM/SP -8          Natural Supports (not living in the home):  Immediate Family   Professional Supports: None   Employment: Unemployed   Type of Work:     Education:  Other (comment) (12th Grade)   Homebound arranged:    Museum/gallery curator Resources:  Medicaid   Other Resources:  [employer]   Cultural/Religious Considerations Which May Impact Care:    Strengths:  Ability to meet basic needs ,Pediatrician chosen,Home prepared for child    Psychotropic Medications:         Pediatrician:    [facility] area  Pediatrician List:   [place]      Pediatrician Fax Number:    Risk Factors/Current Problems:  None   Cognitive State:  Linear Thinking ,Alert    Mood/Affect:  Calm    CSW Assessment: CSW consulted for teen pregnancy. MOB does not meet criteria for teen pregnancy referral but CSW noted THC use and baby being drug screened.  CSW met with MOB to complete assessment and offer support. CSW observed baby in bassinet sleeping. CSW informed MOB of reason for consult. MOB reported she last used THC at the beginning of pregnancy in August. MOB denies having used any additional substances. MOB stated she was a regular THC smoker prior to pregnancy. CSW informed MOB of the hospital drug screen policy. MOB was informed baby's UDS is negative and the CDS will be followed. MOB aware a CPS report is required if baby test positive for substances and denies the household having any CPS history.  MOB denies any mental health history and reported she is currently feeling good. MOB stated her mom is supportive and she will have adequate support at home. MOB denies any current SI, HI or DV. MOB reported she is unsure if FOB will be involved at this time.   CSW provided education regarding the baby blues period vs. perinatal mood disorders, discussed treatment and gave resources for mental health follow up if concerns arise.  CSW recommends self-evaluation during the postpartum time period using the New Mom Checklist from Postpartum Progress and encouraged MOB to contact a medical professional if symptoms are noted at any time.   CSW provided review of Sudden Infant Death Syndrome (SIDS) precautions. MOB reported she  has all needs for baby. MOB denies any barriers to follow-up care and reported she has no additional needs at this time.     CSW will continue to follow CDS and make a CPS report if warranted. CSW identifies no further need for intervention and no barriers to discharge at this time.  CSW Plan/Description:  No Further Intervention Required/No Barriers to Discharge,Hospital Drug Screen Policy Information,Child Protective Service Report ,CSW Will Continue to Monitor Umbilical Cord Tissue Drug Screen Results and Make Report if Warranted,Perinatal Mood and Anxiety Disorder (PMADs) Education,Sudden Infant Death Syndrome (SIDS) Education     Darrin Koman J Tawan Corkern, LCSWA 04/22/2020, 11:26 AM 

## 2020-04-22 NOTE — Anesthesia Postprocedure Evaluation (Signed)
Anesthesia Post Note  Patient: Ashley Graham  Procedure(s) Performed: AN AD HOC LABOR EPIDURAL     Patient location during evaluation: Mother Baby Anesthesia Type: Epidural Level of consciousness: awake and alert Pain management: pain level controlled Vital Signs Assessment: post-procedure vital signs reviewed and stable Respiratory status: spontaneous breathing, nonlabored ventilation and respiratory function stable Cardiovascular status: stable Postop Assessment: no headache, no backache and epidural receding Anesthetic complications: no   No complications documented.  Last Vitals:  Vitals:   04/22/20 0630 04/22/20 0731  BP: (!) 146/94 130/82  Pulse: (!) 114 100  Resp: 16 18  Temp: 37.8 C 36.7 C  SpO2: 99% 99%    Last Pain:  Vitals:   04/22/20 0731  TempSrc: Oral  PainSc:    Pain Goal: Patients Stated Pain Goal: 2 (pt states this is tolerable) (04/21/20 1131)                 Elige Shouse

## 2020-04-23 ENCOUNTER — Other Ambulatory Visit (HOSPITAL_COMMUNITY): Payer: Self-pay | Admitting: Family Medicine

## 2020-04-23 MED ORDER — IBUPROFEN 100 MG/5ML PO SUSP: 600.0000 mg | Freq: Four times a day (QID) | ORAL | 0 refills | Status: DC

## 2020-04-23 MED ORDER — ACETAMINOPHEN 160 MG/5ML PO SOLN: 1000.0000 mg | Freq: Four times a day (QID) | ORAL | 0 refills | Status: DC | PRN

## 2020-04-23 MED ORDER — AMLODIPINE BESYLATE 5 MG PO TABS: 5.0000 mg | ORAL_TABLET | Freq: Every day | ORAL | 3 refills | Status: DC

## 2020-04-23 MED FILL — AMLODIPINE BESYLATE 5 MG TA: 5 | 30 days supply | Qty: 30 | Fill #0

## 2020-04-23 NOTE — Discharge Instructions (Signed)
-take tylenol 1000 mg every 6 hours as needed for pain, alternate with ibuprofen 600 mg every 6 hours -think about birth control options-->bedisider.org is a great website! You can get any form of birth control from the health department for free   Postpartum Care After Vaginal Delivery The following information offers guidance about how to care for yourself from the time you deliver your baby to 6-12 weeks after delivery (postpartum period). If you have problems or questions, contact your health care provider for more specific instructions. Follow these instructions at home: Vaginal bleeding  It is normal to have vaginal bleeding (lochia) after delivery. Wear a sanitary pad for bleeding and discharge. ? During the first week after delivery, the amount and appearance of lochia is often similar to a menstrual period. ? Over the next few weeks, it will gradually decrease to a dry, yellow-brown discharge. ? For most women, lochia stops completely by 4-6 weeks after delivery, but can vary.  Change your sanitary pads frequently. Watch for any changes in your flow, such as: ? A sudden increase in volume. ? A change in color. ? Large blood clots.  If you pass a blood clot from your vagina, save it and call your health care provider. Do not flush blood clots down the toilet before talking with your health care provider.  Do not use tampons or douches until your health care provider approves.  If you are not breastfeeding, your period should return 6-8 weeks after delivery. If you are feeding your baby breast milk only, your period may not return until you stop breastfeeding. Perineal care  Keep the area between the vagina and the anus (perineum) clean and dry. Use medicated pads and pain-relieving sprays and creams as directed.  If you had a surgical cut in the perineum (episiotomy) or a tear, check the area for signs of infection until you are healed. Check for: ? More redness, swelling, or  pain. ? Fluid or blood coming from the cut or tear. ? Warmth. ? Pus or a bad smell.  You may be given a squirt bottle to use instead of wiping to clean the perineum area after you use the bathroom. Pat the area gently to dry it.  To relieve pain caused by an episiotomy, a tear, or swollen veins in the anus (hemorrhoids), take a warm sitz bath 2-3 times a day. In a sitz bath, the warm water should only come up to your hips and cover your buttocks.   Breast care  In the first few days after delivery, your breasts may feel heavy, full, and uncomfortable (breast engorgement). Milk may also leak from your breasts. Ask your health care provider about ways to help relieve the discomfort.  If you are breastfeeding: ? Wear a bra that supports your breasts and fits well. Use breast pads to absorb milk that leaks. ? Keep your nipples clean and dry. Apply creams and ointments as told. ? You may have uterine contractions every time you breastfeed for up to several weeks after delivery. This helps your uterus return to its normal size. ? If you have any problems with breastfeeding, notify your health care provider or lactation consultant.  If you are not breastfeeding: ? Avoid touching your breasts. Do not squeeze out (express) milk. Doing this can make your breasts produce more milk. ? Wear a good-fitting bra and use cold packs to help with swelling. Intimacy and sexuality  Ask your health care provider when you can engage in sexual activity.  This may depend upon: ? Your risk of infection. ? How fast you are healing. ? Your comfort and desire to engage in sexual activity.  You are able to get pregnant after delivery, even if you have not had your period. Talk with your health care provider about methods of birth control (contraception) or family planning if you desire future pregnancies. Medicines  Take over-the-counter and prescription medicines only as told by your health care provider.  Take  an over-the-counter stool softener to help ease bowel movements as told by your health care provider.  If you were prescribed an antibiotic medicine, take it as told by your health care provider. Do not stop taking the antibiotic even if you start to feel better.  Review all previous and current prescriptions to check for possible transfer into breast milk. Activity  Gradually return to your normal activities as told by your health care provider.  Rest as much as possible. Nap while your baby is sleeping. Eating and drinking  Drink enough fluid to keep your urine pale yellow.  To help prevent or relieve constipation, eat high-fiber foods every day.  Choose healthy eating to support breastfeeding or weight loss goals.  Take your prenatal vitamins until your health care provider tells you to stop.   General tips/recommendations  Do not use any products that contain nicotine or tobacco. These products include cigarettes, chewing tobacco, and vaping devices, such as e-cigarettes. If you need help quitting, ask your health care provider.  Do not drink alcohol, especially if you are breastfeeding.  Do not take medications or drugs that are not prescribed to you, especially if you are breastfeeding.  Visit your health care provider for a postpartum checkup within the first 3-6 weeks after delivery.  Complete a comprehensive postpartum visit no later than 12 weeks after delivery.  Keep all follow-up visits for you and your baby. Contact a health care provider if:  You feel unusually sad or worried.  Your breasts become red, painful, or hard.  You have a fever or other signs of an infection.  You have bleeding that is soaking through one pad an hour or you have blood clots.  You have a severe headache that doesn't go away or you have vision changes.  You have nausea and vomiting and are unable to eat or drink anything for 24 hours. Get help right away if:  You have chest pain or  difficulty breathing.  You have sudden, severe leg pain.  You faint or have a seizure.  You have thoughts about hurting yourself or your baby. If you ever feel like you may hurt yourself or others, or have thoughts about taking your own life, get help right away. Go to your nearest emergency department or:  Call your local emergency services (911 in the [place]  The National Suicide Prevention Lifeline at 807-053-9358. This suicide crisis helpline is open 24 hours a day.  Text the Crisis Text Line at 843-558-0378 (in the [place] Summary  The period of time after you deliver your newborn up to 6-12 weeks after delivery is called the postpartum period.  Keep all follow-up visits for you and your baby.  Review all previous and current prescriptions to check for possible transfer into breast milk.  Contact a health care provider if you feel unusually sad or worried during the postpartum period. This information is not intended to replace advice given to you by your health care provider. Make sure you discuss any questions you have with  your health care provider. Document Revised: 11/21/2019 Document Reviewed: 11/21/2019 Elsevier Patient Education  2021 [employer].

## 2020-04-28 ENCOUNTER — Ambulatory Visit: Payer: Medicaid Other

## 2020-05-04 ENCOUNTER — Ambulatory Visit: Payer: Medicaid Other

## 2020-05-15 ENCOUNTER — Telehealth: Payer: Self-pay | Admitting: Licensed Clinical Social Worker

## 2020-05-15 NOTE — Telephone Encounter (Signed)
Subjective: Ashley Graham is a G1P1001 completed contraception counseling.  She does not have a history of any mental health concerns. She is not currently sexually active. She is currently using no method  for birth control. She has had recent STD screening on 01/30/2020. Patient states mother as her support system. [name] reports she is not breastfeeding and currently received [facility] assistance for infant formula. [name] reports she does not have any immediate concerns.  @VS @  Birth Control History:  none  MDM Patient counseled on all options for birth control today including LARC. Patient desires no method initiated for birth control.  Assessment:  20 y.o. female considering no method for birth control  Plan: No further plan   12, Ashley Graham 05/15/2020 11:55 AM

## 2020-05-28 ENCOUNTER — Ambulatory Visit (INDEPENDENT_AMBULATORY_CARE_PROVIDER_SITE_OTHER): Payer: Medicaid Other | Admitting: Certified Nurse Midwife

## 2020-05-28 ENCOUNTER — Other Ambulatory Visit: Payer: Self-pay

## 2020-05-28 ENCOUNTER — Encounter: Payer: Self-pay | Admitting: Certified Nurse Midwife

## 2020-05-28 NOTE — Patient Instructions (Signed)
Postpartum Care After Vaginal Delivery The following information offers guidance about how to care for yourself from the time you deliver your baby to 6-12 weeks after delivery (postpartum period). If you have problems or questions, contact your health care provider for more specific instructions. Follow these instructions at home: Vaginal bleeding  It is normal to have vaginal bleeding (lochia) after delivery. Wear a sanitary pad for bleeding and discharge. ? During the first week after delivery, the amount and appearance of lochia is often similar to a menstrual period. ? Over the next few weeks, it will gradually decrease to a dry, yellow-brown discharge. ? For most women, lochia stops completely by 4-6 weeks after delivery, but can vary.  Change your sanitary pads frequently. Watch for any changes in your flow, such as: ? A sudden increase in volume. ? A change in color. ? Large blood clots.  If you pass a blood clot from your vagina, save it and call your health care provider. Do not flush blood clots down the toilet before talking with your health care provider.  Do not use tampons or douches until your health care provider approves.  If you are not breastfeeding, your period should return 6-8 weeks after delivery. If you are feeding your baby breast milk only, your period may not return until you stop breastfeeding. Perineal care  Keep the area between the vagina and the anus (perineum) clean and dry. Use medicated pads and pain-relieving sprays and creams as directed.  If you had a surgical cut in the perineum (episiotomy) or a tear, check the area for signs of infection until you are healed. Check for: ? More redness, swelling, or pain. ? Fluid or blood coming from the cut or tear. ? Warmth. ? Pus or a bad smell.  You may be given a squirt bottle to use instead of wiping to clean the perineum area after you use the bathroom. Pat the area gently to dry it.  To relieve pain  caused by an episiotomy, a tear, or swollen veins in the anus (hemorrhoids), take a warm sitz bath 2-3 times a day. In a sitz bath, the warm water should only come up to your hips and cover your buttocks.   Breast care  In the first few days after delivery, your breasts may feel heavy, full, and uncomfortable (breast engorgement). Milk may also leak from your breasts. Ask your health care provider about ways to help relieve the discomfort.  If you are breastfeeding: ? Wear a bra that supports your breasts and fits well. Use breast pads to absorb milk that leaks. ? Keep your nipples clean and dry. Apply creams and ointments as told. ? You may have uterine contractions every time you breastfeed for up to several weeks after delivery. This helps your uterus return to its normal size. ? If you have any problems with breastfeeding, notify your health care provider or lactation consultant.  If you are not breastfeeding: ? Avoid touching your breasts. Do not squeeze out (express) milk. Doing this can make your breasts produce more milk. ? Wear a good-fitting bra and use cold packs to help with swelling. Intimacy and sexuality  Ask your health care provider when you can engage in sexual activity. This may depend upon: ? Your risk of infection. ? How fast you are healing. ? Your comfort and desire to engage in sexual activity.  You are able to get pregnant after delivery, even if you have not had your period. Talk with   your health care provider about methods of birth control (contraception) or family planning if you desire future pregnancies. °Medicines °· Take over-the-counter and prescription medicines only as told by your health care provider. °· Take an over-the-counter stool softener to help ease bowel movements as told by your health care provider. °· If you were prescribed an antibiotic medicine, take it as told by your health care provider. Do not stop taking the antibiotic even if you start to  feel better. °· Review all previous and current prescriptions to check for possible transfer into breast milk. °Activity °· Gradually return to your normal activities as told by your health care provider. °· Rest as much as possible. Nap while your baby is sleeping. °Eating and drinking °· Drink enough fluid to keep your urine pale yellow. °· To help prevent or relieve constipation, eat high-fiber foods every day. °· Choose healthy eating to support breastfeeding or weight loss goals. °· Take your prenatal vitamins until your health care provider tells you to stop.   °General tips/recommendations °· Do not use any products that contain nicotine or tobacco. These products include cigarettes, chewing tobacco, and vaping devices, such as e-cigarettes. If you need help quitting, ask your health care provider. °· Do not drink alcohol, especially if you are breastfeeding. °· Do not take medications or drugs that are not prescribed to you, especially if you are breastfeeding. °· Visit your health care provider for a postpartum checkup within the first 3-6 weeks after delivery. °· Complete a comprehensive postpartum visit no later than 12 weeks after delivery. °· Keep all follow-up visits for you and your baby. °Contact a health care provider if: °· You feel unusually sad or worried. °· Your breasts become red, painful, or hard. °· You have a fever or other signs of an infection. °· You have bleeding that is soaking through one pad an hour or you have blood clots. °· You have a severe headache that doesn't go away or you have vision changes. °· You have nausea and vomiting and are unable to eat or drink anything for 24 hours. °Get help right away if: °· You have chest pain or difficulty breathing. °· You have sudden, severe leg pain. °· You faint or have a seizure. °· You have thoughts about hurting yourself or your baby. °If you ever feel like you may hurt yourself or others, or have thoughts about taking your own life,  get help right away. Go to your nearest emergency department or: °· Call your local emergency services (911 in the [place] °· The National Suicide Prevention Lifeline at 1-800-273-8255. This suicide crisis helpline is open 24 hours a day. °· Text the Crisis Text Line at 741741 (in the [place] °Summary °· The period of time after you deliver your newborn up to 6-12 weeks after delivery is called the postpartum period. °· Keep all follow-up visits for you and your baby. °· Review all previous and current prescriptions to check for possible transfer into breast milk. °· Contact a health care provider if you feel unusually sad or worried during the postpartum period. °This information is not intended to replace advice given to you by your health care provider. Make sure you discuss any questions you have with your health care provider. °Document Revised: 11/21/2019 Document Reviewed: 11/21/2019 °Elsevier Patient Education © 2021 Elsevier Inc. ° ° °Contraception Choices °Contraception refers to things you do or use to prevent pregnancy. It is also called birth control. There are several methods of birth   control. Talk to your doctor about the best method for you. Hormonal birth control This kind of birth control uses hormones. Here are some types of hormonal birth control:  A tube that is put under the skin of your arm (implant). The tube can stay in for up to 3 years.  Shots you get every 3 months.  Pills you take every day.  A patch you change 1 time each week for 3 weeks. After that, the patch is taken off for 1 week.  A ring you put in the vagina. The ring is left in for 3 weeks. Then it is taken out of the vagina for 1 week. Then a new ring is put in.  Pills you take after unprotected sex. These are called emergency birth control pills.   Barrier birth control Here are some types of barrier birth control:  A thin covering that is put on the penis before sex (female condom). The covering is thrown away  after sex.  A soft, loose covering that is put in the vagina before sex (female condom). The covering is thrown away after sex.  A rubber bowl that sits over the cervix (diaphragm). The bowl must be made for you. The bowl is put into the vagina before sex. The bowl is left in for 6-8 hours after sex. It is taken out within 24 hours.  A small, soft cup that fits over the cervix (cervical cap). The cup must be made for you. The cup should be left in for 6-8 hours after sex. It is taken out within 48 hours.  A sponge that is put into the vagina before sex. It must be left in for at least 6 hours after sex. It must be taken out within 30 hours and thrown away.  A chemical that kills or stops sperm from getting into the womb (uterus). This chemical is called a spermicide. It may be a pill, cream, jelly, or foam to put in the vagina. The chemical should be used at least 10-15 minutes before sex.   IUD birth control IUD means "intrauterine device." It is put inside the womb. There are two kinds:  Hormone IUD. This kind can stay in the womb for 3-5 years.  Copper IUD. This kind can stay in the womb for 10 years. Permanent birth control Here are some types of permanent birth control:  Surgery to block the fallopian tubes.  Having an insert put into each fallopian tube. This method takes 3 months to work. Other forms of birth control must be used for 3 months.  Surgery to tie off the tubes that carry sperm in men (vasectomy). This method takes 3 months to work. Other forms of birth control must be used for 3 months. Natural planning birth control Here are some types of natural planning birth control:  Not having sex on the days the woman could get pregnant.  Using a calendar: ? To keep track of the length of each menstrual cycle. ? To find out what days pregnancy can happen. ? To plan to not have sex on days when pregnancy can happen.  Watching for signs of ovulation and not having sex  during this time. One way the woman can check for ovulation is to check her temperature.  Waiting to have sex until after ovulation. Where to find more information  Centers for Disease Control and Prevention: [employer] Summary  Contraception, also called birth control, refers to things you do or use to prevent pregnancy.  Hormonal methods of birth control include implants, injections, pills, patches, vaginal rings, and emergency birth control pills.  Barrier methods of birth control can include female condoms, female condoms, diaphragms, cervical caps, sponges, and spermicides.  There are two types of IUD (intrauterine device) birth control. An IUD can be put in a woman's womb to prevent pregnancy for several years.  Permanent birth control can be done through a procedure for males, females, or both. Natural planning means not having sex when the woman could get pregnant. This information is not intended to replace advice given to you by your health care provider. Make sure you discuss any questions you have with your health care provider. Document Revised: 08/12/2019 Document Reviewed: 08/12/2019 Elsevier Patient Education  2021 [employer].

## 2020-05-28 NOTE — Progress Notes (Signed)
Post Partum Visit Note  Ashley Graham is a 20 y.o. G11P1001 female who presents for a postpartum visit. She is 5 weeks postpartum following a normal spontaneous vaginal delivery.  I have fully reviewed the prenatal and intrapartum course. The delivery was at 40 gestational weeks.  Anesthesia: epidural. Postpartum course has been constipation. Baby is doing well. Baby is feeding by bottle - Gentle Good Start. Bleeding no bleeding. Bowel function is abnormal: constipation. Bladder function is normal. Patient is not sexually active. Contraception method is none. Postpartum depression screening: negative.   The pregnancy intention screening data noted above was reviewed. Potential methods of contraception were discussed. The patient elected to proceed with Female Condom.    Edinburgh Postnatal Depression Scale - 05/28/20 1058      Edinburgh Postnatal Depression Scale:  In the Past 7 Days   I have been able to laugh and see the funny side of things. 0    I have looked forward with enjoyment to things. 0    I have blamed myself unnecessarily when things went wrong. 0    I have been anxious or worried for no good reason. 0    I have felt scared or panicky for no good reason. 0    Things have been getting on top of me. 0    I have been so unhappy that I have had difficulty sleeping. 0    I have felt sad or miserable. 0    I have been so unhappy that I have been crying. 0    The thought of harming myself has occurred to me. 0    Edinburgh Postnatal Depression Scale Total 0            The following portions of the patient's history were reviewed and updated as appropriate: allergies, current medications, past medical history and problem list.  Review of Systems A comprehensive review of systems was negative.    Objective:  BP 129/84   Pulse 81   Wt 155 lb 6.4 oz (70.5 kg)   BMI 26.67 kg/m    General:  alert, cooperative and no distress   Breasts:  inspection negative, no nipple  discharge or bleeding, no masses or nodularity palpable  Lungs: clear to auscultation bilaterally  Heart:  regular rate and rhythm  Abdomen: soft, non-tender; bowel sounds normal; no masses,  no organomegaly   Vulva:  not evaluated  Vagina: not evaluated  Cervix:  not evaluated  Corpus: not examined  Adnexa:  not evaluated  Rectal Exam: Not performed.        Assessment:  1. Postpartum care and examination - Normal postpartum exam. Pap smear not done at today's visit, <21yo.  - BP stable, discussed with patient she can stop taking Norvasc  - Educated and discussed birth control option with patient, patient reports that she plans to use condoms and does not want to be on hormonal birth control   Plan:   Essential components of care per ACOG recommendations:  1.  Mood and well being: Patient with negative depression screening today. Reviewed local resources for support.  - Patient does not use tobacco. - hx of drug use? No   2. Infant care and feeding:  -Patient currently breastmilk feeding? No  -Social determinants of health (SDOH) reviewed in EPIC. No concerns  3. Sexuality, contraception and birth spacing - Patient does not want a pregnancy in the next year.  Desired family size is 1 children.  - Reviewed forms  of contraception in tiered fashion. Patient desired condoms today.   - Discussed birth spacing of 18 months  4. Sleep and fatigue -Encouraged family/partner/community support of 4 hrs of uninterrupted sleep to help with mood and fatigue  5. Physical Recovery  - Discussed patients delivery and complications - Patient had a 2nd degree laceration, perineal healing reviewed. Patient expressed understanding - Patient has urinary incontinence? No - Patient is safe to resume physical and sexual activity    Sharyon Cable, CNM [facility] [employer], [facility]

## 2020-07-31 ENCOUNTER — Emergency Department (HOSPITAL_COMMUNITY): Payer: Medicaid Other

## 2020-07-31 ENCOUNTER — Emergency Department (HOSPITAL_COMMUNITY)
Admission: EM | Admit: 2020-07-31 | Discharge: 2020-08-01 | Disposition: A | Discharge status: Home / Self Care | Payer: Medicaid Other | Attending: Emergency Medicine | Admitting: Emergency Medicine

## 2020-07-31 ENCOUNTER — Encounter (HOSPITAL_COMMUNITY): Payer: Self-pay

## 2020-07-31 ENCOUNTER — Other Ambulatory Visit: Payer: Self-pay

## 2020-07-31 DIAGNOSIS — Z28310 Unvaccinated for covid-19: Secondary | ICD-10-CM | POA: Insufficient documentation

## 2020-07-31 DIAGNOSIS — Z20822 Contact with and (suspected) exposure to covid-19: Secondary | ICD-10-CM | POA: Diagnosis not present

## 2020-07-31 DIAGNOSIS — N390 Urinary tract infection, site not specified: Secondary | ICD-10-CM | POA: Diagnosis not present

## 2020-07-31 DIAGNOSIS — B9689 Other specified bacterial agents as the cause of diseases classified elsewhere: Secondary | ICD-10-CM | POA: Insufficient documentation

## 2020-07-31 DIAGNOSIS — R509 Fever, unspecified: Secondary | ICD-10-CM | POA: Diagnosis present

## 2020-07-31 DIAGNOSIS — R519 Headache, unspecified: Secondary | ICD-10-CM | POA: Insufficient documentation

## 2020-07-31 LAB — I-STAT BETA HCG BLOOD, ED (MC, WL, AP ONLY): I-stat hCG, quantitative: <5 m[IU]/mL (ref <5)

## 2020-07-31 LAB — CBC WITH DIFFERENTIAL/PLATELET
Abs Immature Granulocytes: 0.02 10*3/uL (ref 0.00–0.07)
Basophils Absolute: 0 10*3/uL (ref 0.0–0.1)
Basophils Relative: 0 %
Eosinophils Absolute: 0 10*3/uL (ref 0.0–0.5)
Eosinophils Relative: 0 %
HCT: 38.7 % (ref 36.0–46.0)
Hemoglobin: 12.5 g/dL (ref 12.0–15.0)
Immature Granulocytes: 0 %
Lymphocytes Relative: 21 %
Lymphs Abs: 1.7 10*3/uL (ref 0.7–4.0)
MCH: 29 pg (ref 26.0–34.0)
MCHC: 32.3 g/dL (ref 30.0–36.0)
MCV: 89.8 fL (ref 80.0–100.0)
Monocytes Absolute: 0.8 10*3/uL (ref 0.1–1.0)
Monocytes Relative: 10 %
Neutro Abs: 5.4 10*3/uL (ref 1.7–7.7)
Neutrophils Relative %: 69 %
Platelets: 252 10*3/uL (ref 150–400)
RBC: 4.31 MIL/uL (ref 3.87–5.11)
RDW: 14.7 % (ref 11.5–15.5)
WBC: 8 10*3/uL (ref 4.0–10.5)
nRBC: 0 % (ref 0.0–0.2)

## 2020-07-31 LAB — URINALYSIS, ROUTINE W REFLEX MICROSCOPIC
Bilirubin Urine: NEGATIVE
Glucose, UA: NEGATIVE mg/dL
Ketones, ur: 20 mg/dL — AB
Nitrite: NEGATIVE
Protein, ur: NEGATIVE mg/dL
Specific Gravity, Urine: 1.012 (ref 1.005–1.030)
pH: 6 (ref 5.0–8.0)

## 2020-07-31 LAB — BASIC METABOLIC PANEL
Anion gap: 11 (ref 5–15)
BUN: 7 mg/dL (ref 6–20)
CO2: 21 mmol/L — ABNORMAL LOW (ref 22–32)
Calcium: 9.5 mg/dL (ref 8.9–10.3)
Chloride: 100 mmol/L (ref 98–111)
Creatinine, Ser: 0.88 mg/dL (ref 0.44–1.00)
GFR, Estimated: >60 mL/min (ref >60)
Glucose, Bld: 100 mg/dL — ABNORMAL HIGH (ref 70–99)
Potassium: 3 mmol/L — ABNORMAL LOW (ref 3.5–5.1)
Sodium: 132 mmol/L — ABNORMAL LOW (ref 135–145)

## 2020-07-31 LAB — RESP PANEL BY RT-PCR (FLU A&B, COVID) ARPGX2
Influenza A by PCR: NEGATIVE
Influenza B by PCR: NEGATIVE
SARS Coronavirus 2 by RT PCR: NEGATIVE

## 2020-07-31 MED ORDER — ACETAMINOPHEN 325 MG PO TABS
650.0000 mg | ORAL_TABLET | Freq: Once | ORAL | Status: AC
  Administered 2020-07-31: 650 mg via ORAL
  Filled 2020-07-31: qty 2

## 2020-07-31 NOTE — ED Notes (Signed)
Pt called x 3 , no answer

## 2020-07-31 NOTE — ED Triage Notes (Signed)
Pt states she has had a fever of 103.8 today. States she feels bad and has a headache and nausea. Pt is able to bend neck back and forth and front/back.

## 2020-07-31 NOTE — ED Provider Notes (Addendum)
Emergency Medicine Provider Triage Evaluation Note  Ashley Graham , a 19 y.o. female  was evaluated in triage.  Pt complains of fever and "slight headache."  Reports T-max of 103.8 F orally.  Patient checked temperature earlier today.  She has not taken any Tylenol, ibuprofen, or Motrin.  Patient rates pain 1/10 on pain scale.  Reports headache was gradual in onset.  Patient denies any known sick contacts.  Patient has not been vaccinated for COVID-19 or influenza.  Review of Systems  Positive: Fever, headache Negative: Nasal congestion, rhinorrhea, cough, abdominal pain, shob, visual disturbance, numbness, weakness, facial asymmetry, slurred speech, focal neurological deficit  Physical Exam  BP 110/71 (BP Location: Left Arm)   Pulse (!) 111   Temp (!) 103.2 F (39.6 C)   Resp 16   SpO2 100%  Gen:   Awake, no distress   Resp:  Normal effort, patient able to speak in full complete sentences, lungs clear to auscultation bilaterally MSK:   Moves extremities without difficulty  Other:  No slurred speech  Medical Decision Making  Medically screening exam initiated at 5:46 PM.  Appropriate orders placed.  Ashley Graham was informed that the remainder of the evaluation will be completed by another provider, this initial triage assessment does not replace that evaluation, and the importance of remaining in the ED until their evaluation is complete.  The patient appears stable so that the remainder of the work up may be completed by another provider.      Haskel Schroeder, PA-C 07/31/20 1752    Haskel Schroeder, PA-C 07/31/20 1752    Terrilee Files, MD 08/01/20 1236

## 2020-08-01 ENCOUNTER — Emergency Department (HOSPITAL_COMMUNITY)
Admission: EM | Admit: 2020-08-01 | Discharge: 2020-08-01 | Disposition: A | Discharge status: Home / Self Care | Payer: Medicaid Other | Source: Home / Self Care | Attending: Emergency Medicine | Admitting: Emergency Medicine

## 2020-08-01 DIAGNOSIS — B9689 Other specified bacterial agents as the cause of diseases classified elsewhere: Secondary | ICD-10-CM | POA: Insufficient documentation

## 2020-08-01 DIAGNOSIS — N390 Urinary tract infection, site not specified: Secondary | ICD-10-CM | POA: Insufficient documentation

## 2020-08-01 MED ORDER — CEPHALEXIN 500 MG PO CAPS: 500.0000 mg | ORAL_CAPSULE | Freq: Four times a day (QID) | ORAL | 0 refills | Status: AC

## 2020-08-01 NOTE — ED Triage Notes (Addendum)
Pt back today for recurrent fever. Seen yesterday for same and given Tylenol. Woke up this am with temp of 100.4 and is back for eval. Endorses mild headache, R leg pain, but no other complaints. Negative covid and flu tests yesterday. No OTC meds PTA.

## 2020-08-01 NOTE — ED Provider Notes (Signed)
[facility] EMERGENCY DEPARTMENT Provider Note   CSN: [id number] Arrival date & time: 08/01/20  0815     History Chief Complaint  Patient presents with  . Fever    Ashley Graham is a 20 y.o. female.  Pt thinks she has a uti.  Pt came in yesterday but did not wait to be seen   The history is provided by the patient. No language interpreter was used.  Fever Max temp prior to arrival:  103 Temp source:  Oral Severity:  Moderate Onset quality:  Gradual Duration:  2 days Timing:  Constant Progression:  Worsening Chronicity:  New Relieved by:  Nothing Worsened by:  Nothing Associated symptoms: dysuria   Risk factors: no recent sickness        Past Medical History:  Diagnosis Date  . Anemia in pregnancy 02/02/2020   CBC Latest Ref Rng & Units 02/12/2020 01/30/2020 01/16/2020 WBC 3.4 - 10.8 x10E3/uL - 7.2 9.2 Hemoglobin 11.1 - 15.9 g/dL - 8.6(L) 8.5(L) Hematocrit 34.0 - 46.6 % 27.2(L) 26.5(L) 27.1(L) Platelets 150 - 450 x10E3/uL - 254 269   Fereheme scheduled 03/05/2020 & 03/12/2020  . Heart murmur   . History of PCR DNA positive for HSV1   . Indication for care in labor or delivery 04/21/2020  . Supervision of normal first teen pregnancy 10/02/2019    Nursing Staff Provider Office Location  [facility] Dating  LMP Language  ENGLISH  Anatomy US  Normal Flu Vaccine   Declined 01/16/2020 Genetic Screen  NIPS: low risk  AFP:  negative    TDaP vaccine   01-30-20 Hgb A1C or  GTT Early  Third trimester:  Glucose, Fasting 65 - 91 mg/dL 70  Glucose, 1 hour 65 - 179 mg/dL 124  Glucose, 2 hour 65 - 152 mg/dL 93   Rhogam  NA   LAB RESULTS    Blood Type A/Posit  . Uterine size date discrepancy 03/23/2020   EFW 15% at [redacted]w[redacted]d . Vaginal delivery 04/23/2020    Patient Active Problem List   Diagnosis Date Noted  . Gestational hypertension 04/21/2020  . Chorioamnionitis 04/21/2020  . Excess weight gain in pregnancy 04/15/2020    Past Surgical History:  Procedure Laterality Date   . NO PAST SURGERIES       OB History    Gravida  1   Para  1   Term  1   Preterm      AB      Living  1     SAB      IAB      Ectopic      Multiple  0   Live Births  1           Family History  Problem Relation Age of Onset  . Healthy Mother   . Diabetes Maternal Uncle   . Diabetes Other   . Hypertension Other     Social History   Tobacco Use  . Smoking status: Never Smoker  . Smokeless tobacco: Never Used  Vaping Use  . Vaping Use: Never used  Substance Use Topics  . Alcohol use: Never  . Drug use: Yes    Types: Marijuana    Comment: last use 11/14/19    Home Medications Prior to Admission medications   Medication Sig Start Date End Date Taking? Authorizing Provider  acetaminophen (TYLENOL) 160 MG/5ML solution TAKE 31.3 MLS (1,000 MG TOTAL) BY MOUTH EVERY 6 (SIX) HOURS AS NEEDED FOR MILD PAIN, MODERATE  PAIN OR FEVER (PAIN SCALE < 4). Patient not taking: Reported on 05/28/2020 04/23/20 04/27/04  Arrie Senate, MD  amLODipine (NORVASC) 5 MG tablet TAKE 1 TABLET (5 MG TOTAL) BY MOUTH DAILY. Patient not taking: Reported on 05/28/2020 04/23/20 04/23/74  Arrie Senate, MD  Blood Pressure Monitoring (BLOOD PRESSURE KIT) DEVI 1 kit by Does not apply route once a week. Check Blood Pressure regularly and record readings into the [employer] App.  Large Cuff.  DX O90.0 Patient not taking: Reported on 05/28/2020 01/16/20   Luvenia Redden, PA-C  ibuprofen (ADVIL) 100 MG/5ML suspension TAKE 30 MLS (600 MG TOTAL) BY MOUTH EVERY 6 (SIX) HOURS. Patient not taking: Reported on 05/28/2020 04/23/20 04/28/29  Arrie Senate, MD    Allergies    Patient has no known allergies.  Review of Systems   Review of Systems  Constitutional: Positive for fever.  Genitourinary: Positive for dysuria.  All other systems reviewed and are negative.   Physical Exam Updated Vital Signs BP 113/71   Pulse 90   Temp 99.3 F (37.4 C)   Resp 14   SpO2 100%   Physical  Exam Vitals and nursing note reviewed.  Constitutional:      Appearance: She is well-developed.  HENT:     Head: Normocephalic.  Cardiovascular:     Rate and Rhythm: Normal rate.  Pulmonary:     Effort: Pulmonary effort is normal.  Abdominal:     General: There is no distension.  Musculoskeletal:        General: Normal range of motion.     Cervical back: Normal range of motion.  Neurological:     General: No focal deficit present.     Mental Status: She is alert and oriented to person, place, and time.  Psychiatric:        Mood and Affect: Mood normal.     ED Results / Procedures / Treatments   Labs (all labs ordered are listed, but only abnormal results are displayed) Labs Reviewed  URINE CULTURE    EKG None  Radiology DG Chest [facility] 1 View  Result Date: 07/31/2020 CLINICAL DATA:  Fever with headache EXAM: PORTABLE CHEST 1 VIEW COMPARISON:  None. FINDINGS: Lungs are clear. Heart size and pulmonary vascularity are normal. No adenopathy. No bone lesions. IMPRESSION: Lungs clear.  Cardiac silhouette normal. Electronically Signed   By: Lowella Grip III M.D.   On: 07/31/2020 19:24    Procedures Procedures   Medications Ordered in ED Medications - No data to display  ED Course  I have reviewed the triage vital signs and the nursing notes.  Pertinent labs & imaging results that were available during my care of the patient were reviewed by me and considered in my medical decision making (see chart for details).    MDM Rules/Calculators/A&P                          MDM:  ua shows wbc's and bacteria.  Ua ordered today in order to culture.   Final Clinical Impression(s) / ED Diagnoses Final diagnoses:  Urinary tract infection with hematuria, site unspecified    Rx / DC Orders ED Discharge Orders    None    An After Visit Summary was printed and given to the patient.    Fransico Meadow, [place] 08/01/20 [id number]    Lucrezia Starch, MD 08/03/20 [phone]

## 2020-08-01 NOTE — Discharge Instructions (Signed)
Return if any problems.

## 2020-08-03 LAB — URINE CULTURE: Culture: >=100000 — AB

## 2020-08-04 ENCOUNTER — Telehealth: Payer: Self-pay | Admitting: Emergency Medicine

## 2020-08-04 NOTE — Telephone Encounter (Signed)
Post ED Visit - Positive Culture Follow-up  Culture report reviewed by antimicrobial stewardship pharmacist: [facility] []  , Pharm.D. []  Enzo Bi, Pharm.D., BCPS AQ-ID []  , Pharm.D., BCPS []  Celedonio Miyamoto, Pharm.D., BCPS []  [place], Garvin Fila.D., BCPS, AAHIVP []  , Pharm.D., BCPS, AAHIVP []  Georgina Pillion, PharmD, BCPS []  , PharmD, BCPS []  [place], PharmD, BCPS []  [address], PharmD []  , PharmD, BCPS []  Estella Husk, PharmD  Pharmacy Team []  Lysle Pearl, PharmD []  , PharmD []  Phillips Climes, PharmD []  , Rph []  [employer]apito [employer]) , PharmD []  Verlan Friends, PharmD []  , PharmD []  Mervyn Gay, PharmD []  , PharmD []  Vinnie Level, PharmD []  [facility], PharmD []  , PharmD []  Len Childs, PharmD   Positive urine culture Treated with cephalexin, organism sensitive to the same and no further patient follow-up is required at this time.  08/04/2020, 10:25 AM

## 2021-12-14 IMAGING — US US MFM OB COMP +14 WKS
1 series · 13 of 28 positions shown · non-contrast
Comparison: none

[Series 1: us mfm ob comp +14 wks · 109 acquisitions, 13 frames shown]
[im 5/109]
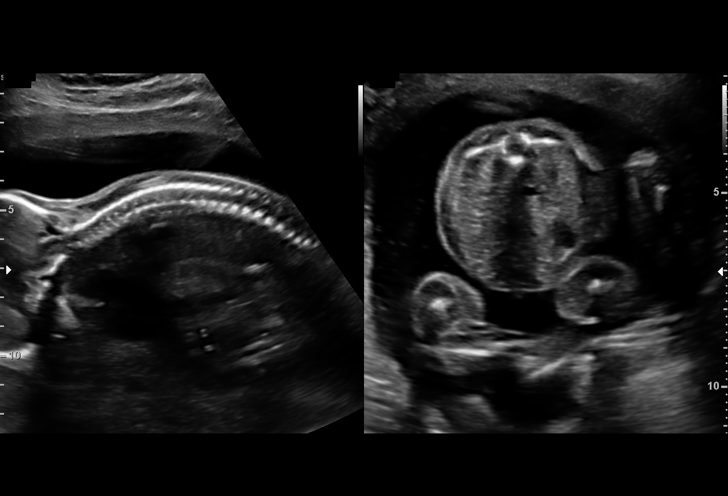
[im 13/109]
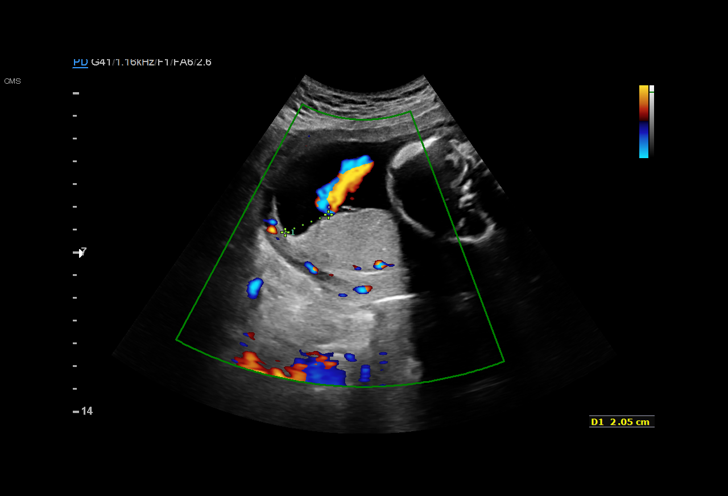
[im 21/109]
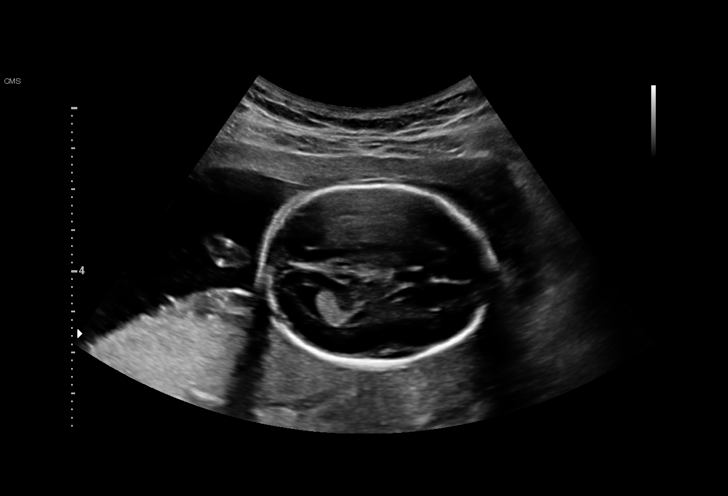
[im 29/109]
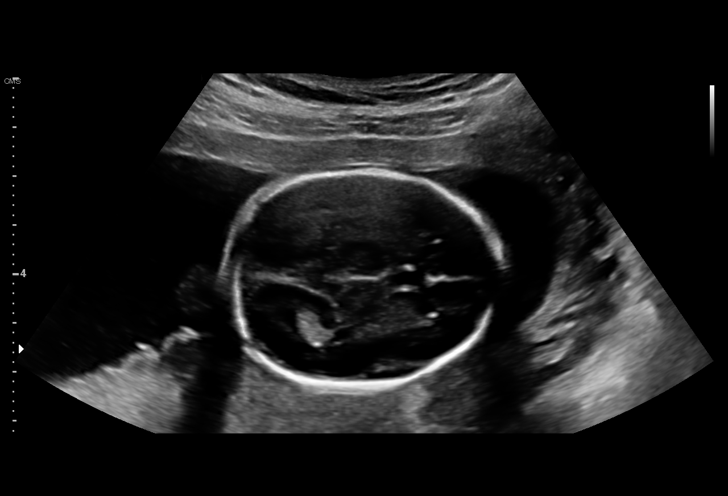
[im 37/109]
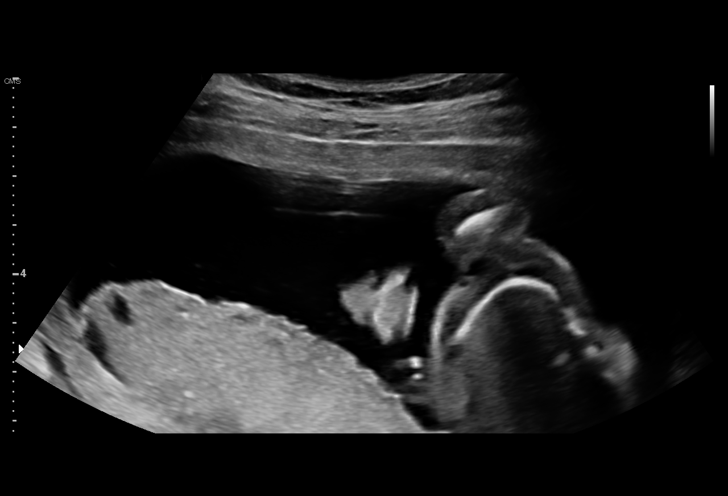
[im 45/109]
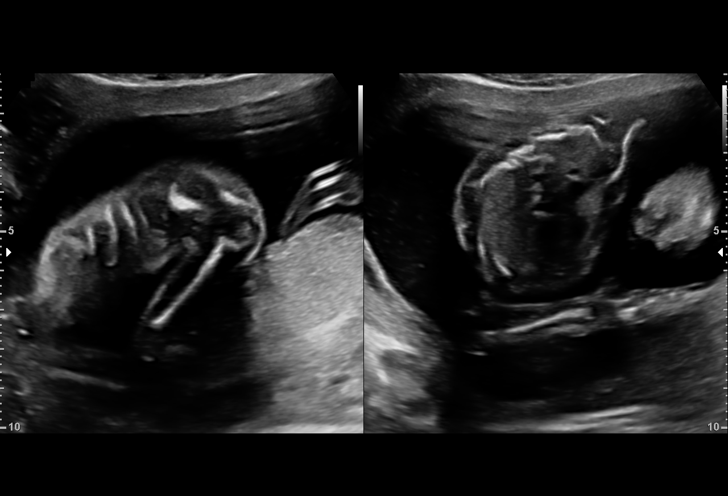
[im 57/109]
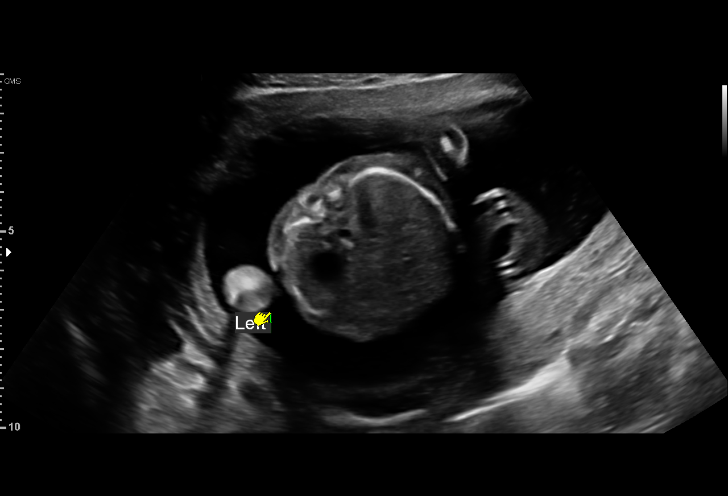
[im 65/109]
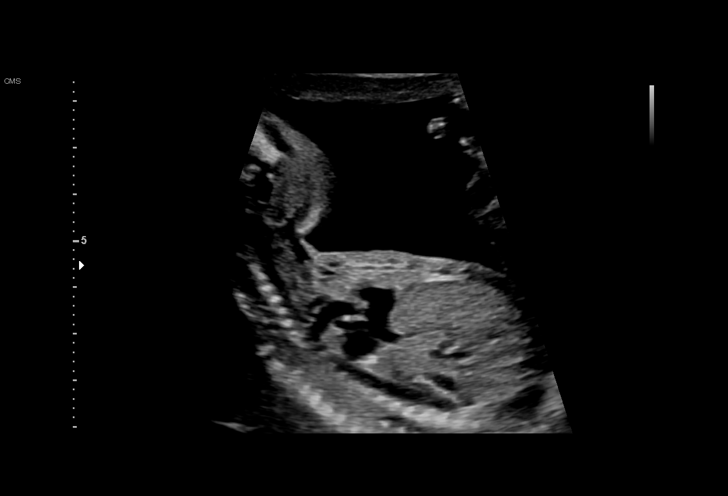
[im 73/109]
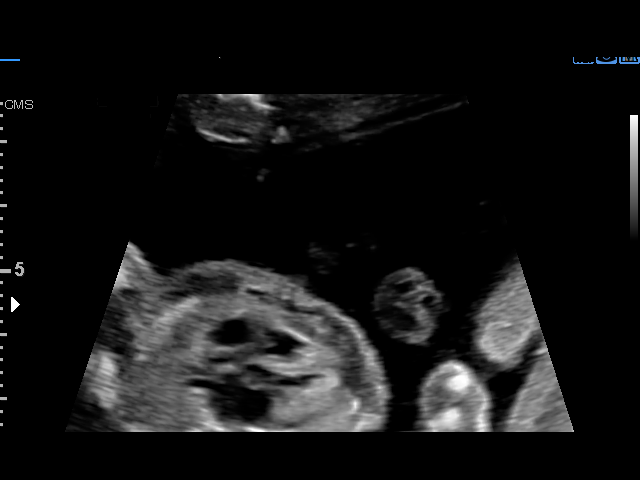
[im 81/109]
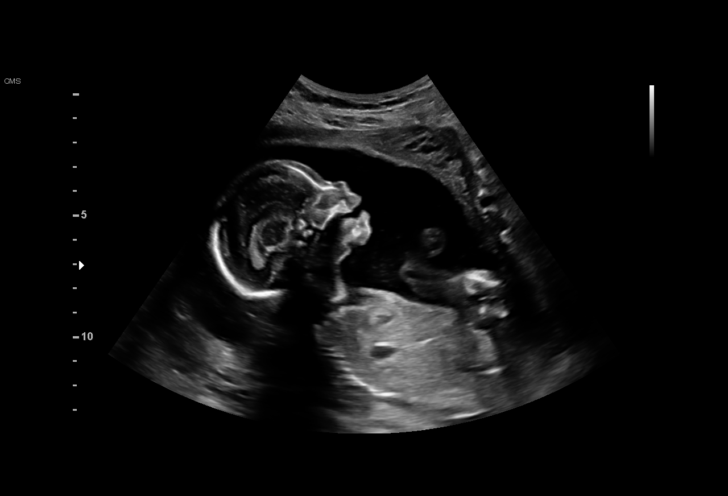
[im 89/109]
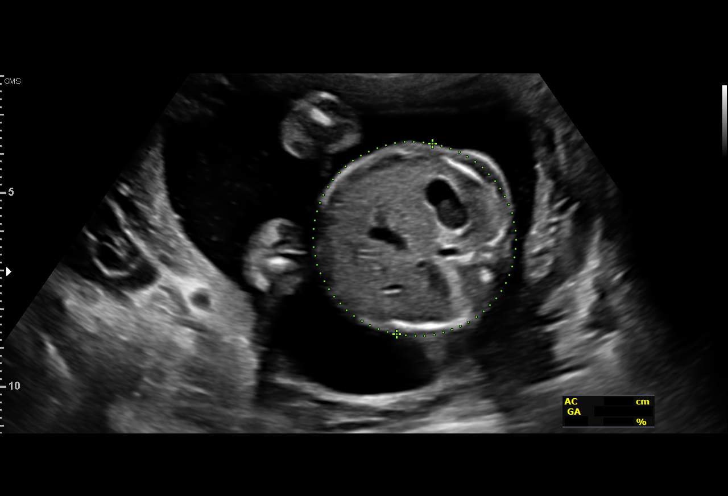
[im 97/109]
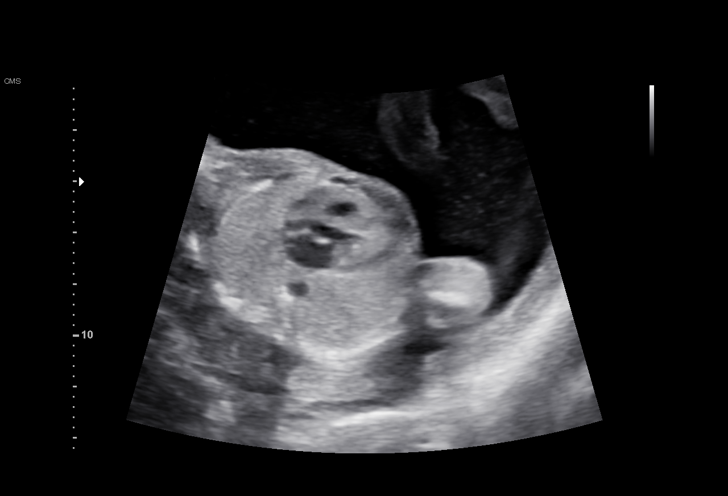
[im 105/109]
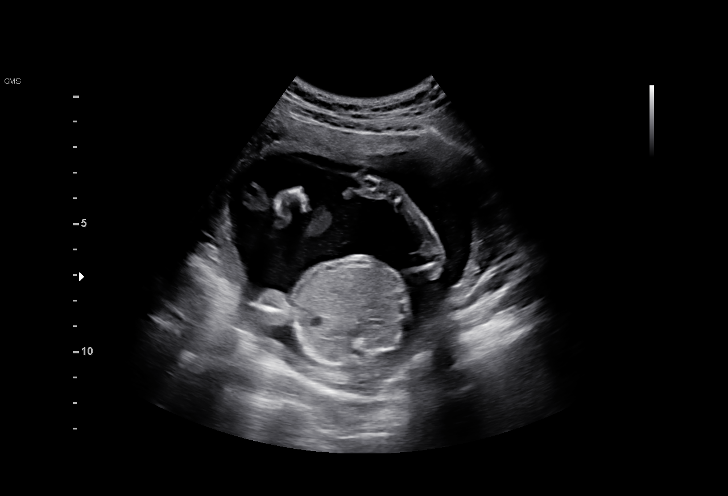

[13 of 28 positions shown; findings below may reference images not displayed]

1  US MFM OB COMP + 14 WK                76805.01    ANETH WINT

Indications

 Antenatal screening for malformations
 20 weeks gestation of pregnancy
Fetal Evaluation

 Num Of Fetuses:         1
 Fetal Heart Rate(bpm):  129
 Cardiac Activity:       Observed
 Presentation:           Breech
 Placenta:               Posterior
 P. Cord Insertion:      Visualized

 Amniotic Fluid
 AFI FV:      Within normal limits

                             Largest Pocket(cm)

Biometry

 BPD:      43.5  mm     G. Age:  19w 1d         10  %    CI:        69.69   %    70 - 86
                                                         FL/HC:      20.7   %    16.8 -
 HC:      166.3  mm     G. Age:  19w 2d          8  %    HC/AC:      1.08        1.09 -
 AC:      154.2  mm     G. Age:  20w 4d         55  %    FL/BPD:     79.1   %
 FL:       34.4  mm     G. Age:  20w 6d         61  %    FL/AC:      22.3   %    20 - 24
 HUM:      30.1  mm     G. Age:  20w 0d         41  %
 CER:      19.9  mm     G. Age:  19w 2d         35  %
 NFT:       4.7  mm
 CM:        3.6  mm

 Est. FW:     359  gm    0 lb 13 oz      59  %
OB History

 Gravidity:    1         Term:   0        Prem:   0        SAB:   0
 TOP:          0       Ectopic:  0        Living: 0
Gestational Age

 LMP:           20w 2d        Date:  07/14/19                 EDD:   04/19/20
 U/S Today:     20w 0d                                        EDD:   04/21/20
 Best:          20w 2d     Det. By:  LMP  (07/14/19)          EDD:   04/19/20
Anatomy

 Cranium:               Appears normal         LVOT:                   Appears normal
 Cavum:                 Appears normal         Aortic Arch:            Appears normal
 Ventricles:            Appears normal         Ductal Arch:            Appears normal
 Choroid Plexus:        Appears normal         Diaphragm:              Appears normal
 Cerebellum:            Appears normal         Stomach:                Appears normal, left
                                                                       sided
 Posterior Fossa:       Appears normal         Abdomen:                Appears normal
 Nuchal Fold:           Appears normal         Abdominal Wall:         Appears nml (cord
                                                                       insert, abd wall)
 Face:                  Appears normal         Cord Vessels:           Appears normal (3
                        (orbits and profile)                           vessel cord)
 Lips:                  Appears normal         Kidneys:                Appear normal
 Palate:                Not well visualized    Bladder:                Appears normal
 Thoracic:              Appears normal         Spine:                  Appears normal
 Heart:                 Appears normal         Upper Extremities:      Appears normal
                        (4CH, axis, and
                        situs)
 RVOT:                  Appears normal         Lower Extremities:      Appears normal

 Other:  Heels/feet and LEFT open hands/5th digits visualized. Nasal bone
         visualized. Technically difficult due to fetal position.
Cervix Uterus Adnexa

 Cervix
 Length:           3.13  cm.
 Normal appearance by transabdominal scan.

 Uterus
 No abnormality visualized.

 Right Ovary
 Within normal limits.

 Left Ovary
 Not visualized.

 Cul De Sac
 No free fluid seen.
 Adnexa
 No abnormality visualized.
Comments

 This patient was seen for a detailed fetal anatomy scan.
 She denies any significant past medical history and denies
 any problems in her current pregnancy.
 She had a cell free DNA test earlier in her pregnancy which
 indicated a low risk for trisomy 21, 18, and 13. A male fetus is
 predicted.
 She was informed that the fetal growth and amniotic fluid
 level were appropriate for her gestational age.
 There were no obvious fetal anomalies noted on today's
 ultrasound exam.
 The patient was informed that anomalies may be missed due
 to technical limitations. If the fetus is in a suboptimal position
 or maternal habitus is increased, visualization of the fetus in
 the maternal uterus may be impaired.
 The patient inquired if she should get the FZGI3-PC vaccine
 during her pregnancy.  She was advised that the FZGI3-PC
 vaccine is currently recommended by all national
 organizations for the prevention of acute and severe illness
 due to Covid 19 for all pregnant and lactating women.  Based
 on the available data, it does not appear that the FZGI3-PC
 vaccine is associated with harm to the pregnancy or fetus.
 She was encouraged to get the vaccine should she desire.
 Follow up as indicated.

## 2022-08-12 IMAGING — DX DG CHEST 1V PORT
1 series · 1 of 1 positions shown · non-contrast
Comparison: None.

CLINICAL DATA: Fever with headache

EXAM:
PORTABLE CHEST 1 VIEW

[chest]
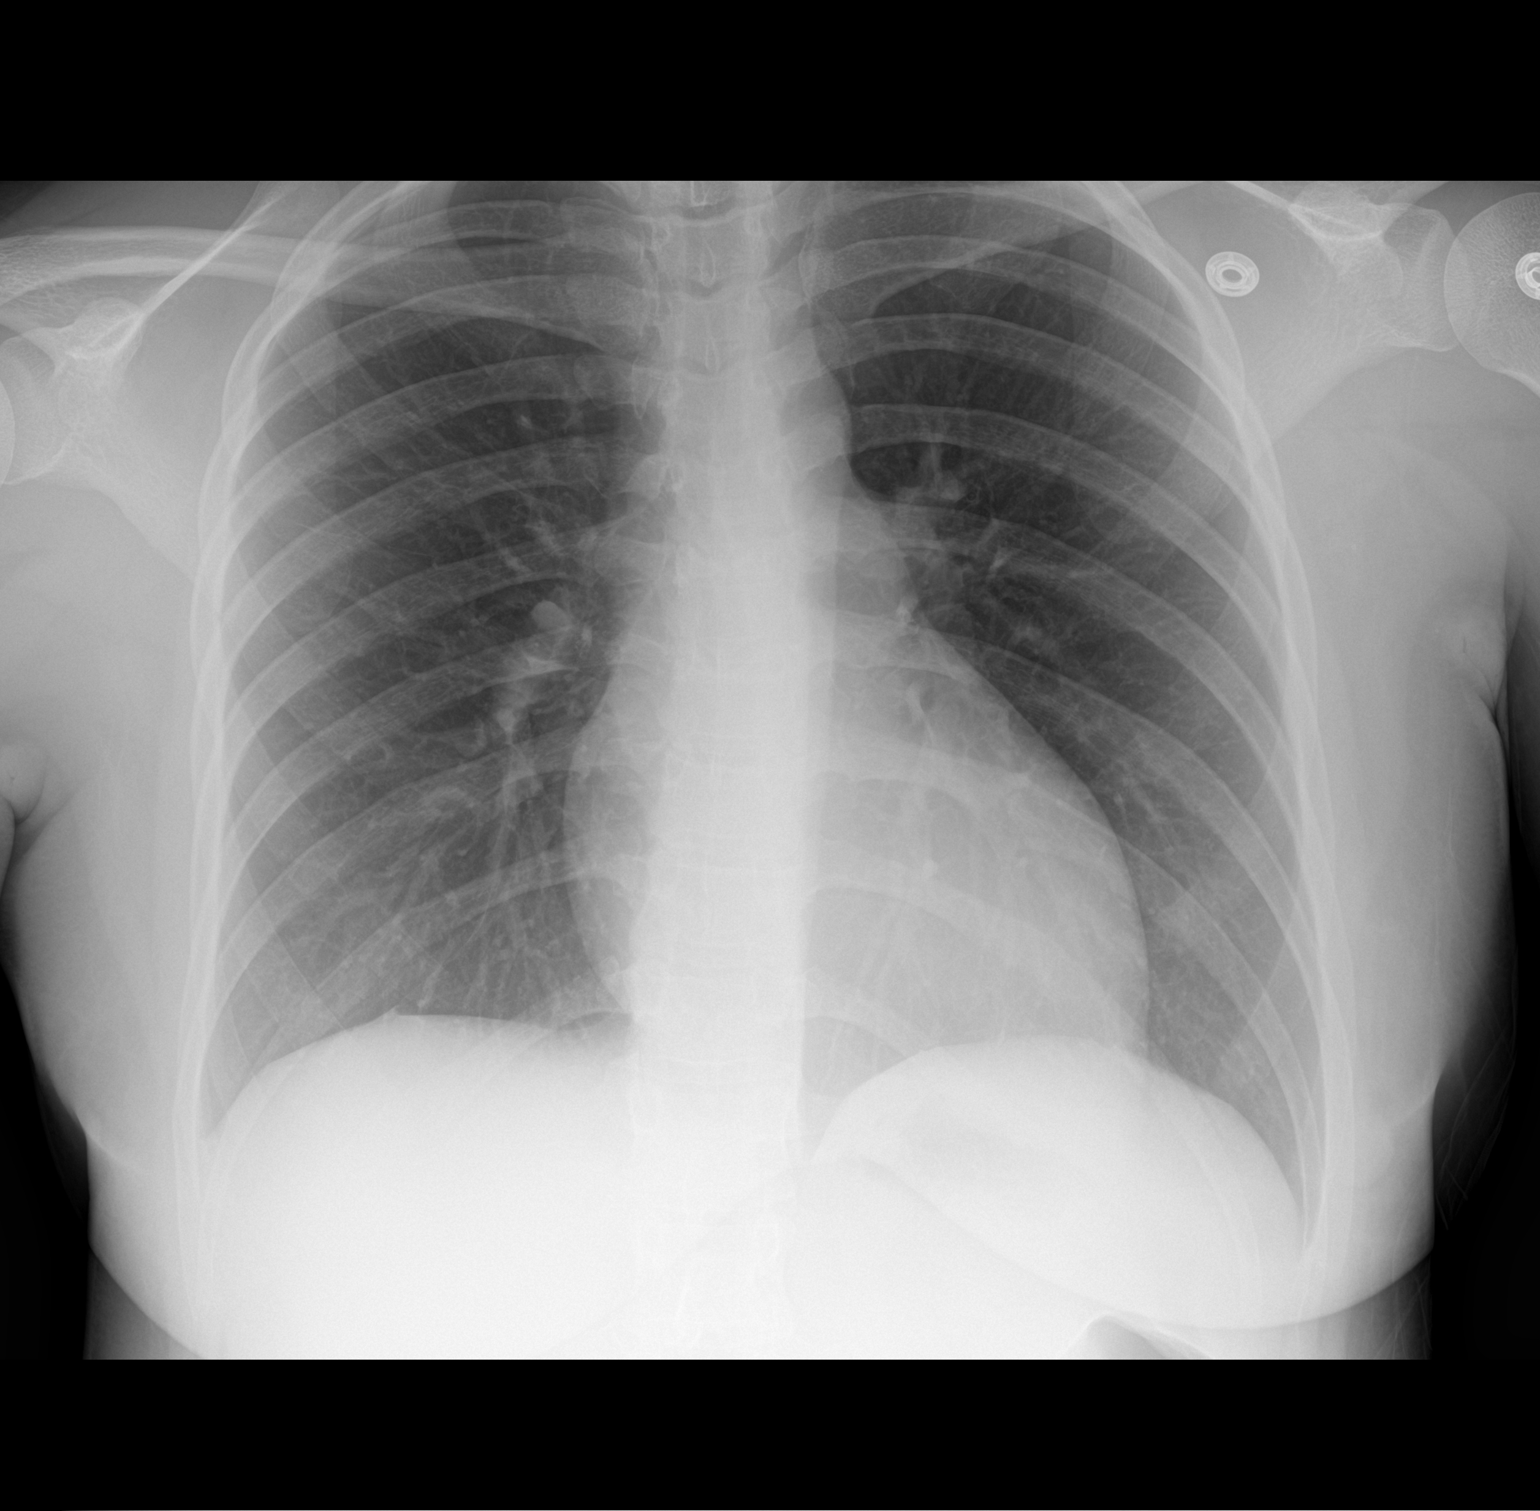

[1 of 1 positions shown; findings below may reference images not displayed]

FINDINGS: Lungs are clear. Heart size and pulmonary vascularity are normal. No
adenopathy. No bone lesions.
IMPRESSION: Lungs clear.  Cardiac silhouette normal.
